# Patient Record
Sex: Male | Born: 1960 | Race: White | Hispanic: No | Marital: Married | Smoking: Never smoker
Health system: Southern US, Community
[De-identification: ages and names within clinical notes are randomized; demographics above are authoritative.]

## PROBLEM LIST (undated history)

## (undated) DIAGNOSIS — F32A Depression, unspecified: Secondary | ICD-10-CM

## (undated) DIAGNOSIS — Z973 Presence of spectacles and contact lenses: Secondary | ICD-10-CM

## (undated) DIAGNOSIS — R0602 Shortness of breath: Secondary | ICD-10-CM

## (undated) DIAGNOSIS — R002 Palpitations: Secondary | ICD-10-CM

## (undated) DIAGNOSIS — K5900 Constipation, unspecified: Secondary | ICD-10-CM

## (undated) DIAGNOSIS — R12 Heartburn: Secondary | ICD-10-CM

## (undated) DIAGNOSIS — M1990 Unspecified osteoarthritis, unspecified site: Secondary | ICD-10-CM

## (undated) DIAGNOSIS — K429 Umbilical hernia without obstruction or gangrene: Secondary | ICD-10-CM

## (undated) DIAGNOSIS — R079 Chest pain, unspecified: Secondary | ICD-10-CM

## (undated) DIAGNOSIS — G4730 Sleep apnea, unspecified: Secondary | ICD-10-CM

## (undated) DIAGNOSIS — E559 Vitamin D deficiency, unspecified: Secondary | ICD-10-CM

## (undated) DIAGNOSIS — E7800 Pure hypercholesterolemia, unspecified: Secondary | ICD-10-CM

## (undated) DIAGNOSIS — I4891 Unspecified atrial fibrillation: Secondary | ICD-10-CM

## (undated) DIAGNOSIS — M7989 Other specified soft tissue disorders: Secondary | ICD-10-CM

## (undated) DIAGNOSIS — M2550 Pain in unspecified joint: Secondary | ICD-10-CM

## (undated) DIAGNOSIS — R7303 Prediabetes: Secondary | ICD-10-CM

## (undated) DIAGNOSIS — Z87442 Personal history of urinary calculi: Secondary | ICD-10-CM

## (undated) DIAGNOSIS — Z889 Allergy status to unspecified drugs, medicaments and biological substances status: Secondary | ICD-10-CM

## (undated) DIAGNOSIS — K5790 Diverticulosis of intestine, part unspecified, without perforation or abscess without bleeding: Secondary | ICD-10-CM

## (undated) DIAGNOSIS — M549 Dorsalgia, unspecified: Secondary | ICD-10-CM

## (undated) DIAGNOSIS — F329 Major depressive disorder, single episode, unspecified: Secondary | ICD-10-CM

## (undated) HISTORY — DX: Pain in unspecified joint: M25.50

## (undated) HISTORY — PX: WRIST SURGERY: SHX841

## (undated) HISTORY — DX: Heartburn: R12

## (undated) HISTORY — DX: Constipation, unspecified: K59.00

## (undated) HISTORY — DX: Unspecified atrial fibrillation: I48.91

## (undated) HISTORY — DX: Sleep apnea, unspecified: G47.30

## (undated) HISTORY — DX: Other specified soft tissue disorders: M79.89

## (undated) HISTORY — PX: COLONOSCOPY: SHX174

## (undated) HISTORY — DX: Shortness of breath: R06.02

## (undated) HISTORY — DX: Diverticulosis of intestine, part unspecified, without perforation or abscess without bleeding: K57.90

## (undated) HISTORY — DX: Chest pain, unspecified: R07.9

## (undated) HISTORY — DX: Vitamin D deficiency, unspecified: E55.9

## (undated) HISTORY — PX: WISDOM TOOTH EXTRACTION: SHX21

## (undated) HISTORY — DX: Palpitations: R00.2

## (undated) HISTORY — PX: CARDIAC CATHETERIZATION: SHX172

---
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
## 2004-10-13 ENCOUNTER — Ambulatory Visit (HOSPITAL_COMMUNITY): Admission: RE | Admit: 2004-10-13 | Discharge: 2004-10-13 | Payer: Self-pay | Admitting: *Deleted

## 2004-10-29 ENCOUNTER — Encounter: Admission: RE | Admit: 2004-10-29 | Discharge: 2004-10-29 | Payer: Self-pay | Admitting: Emergency Medicine

## 2011-04-03 ENCOUNTER — Other Ambulatory Visit: Payer: Self-pay | Admitting: Family Medicine

## 2011-04-17 ENCOUNTER — Ambulatory Visit
Admission: RE | Admit: 2011-04-17 | Discharge: 2011-04-17 | Disposition: A | Discharge status: Home / Self Care | Payer: PRIVATE HEALTH INSURANCE | Source: Ambulatory Visit | Attending: Family Medicine | Admitting: Family Medicine

## 2012-07-20 ENCOUNTER — Emergency Department (HOSPITAL_COMMUNITY): Payer: PRIVATE HEALTH INSURANCE

## 2012-07-20 ENCOUNTER — Encounter (HOSPITAL_COMMUNITY): Payer: Self-pay | Admitting: *Deleted

## 2012-07-20 ENCOUNTER — Emergency Department (HOSPITAL_COMMUNITY)
Admission: EM | Admit: 2012-07-20 | Discharge: 2012-07-20 | Disposition: A | Discharge status: Home / Self Care | Payer: PRIVATE HEALTH INSURANCE | Attending: Emergency Medicine | Admitting: Emergency Medicine

## 2012-07-20 DIAGNOSIS — N201 Calculus of ureter: Secondary | ICD-10-CM | POA: Insufficient documentation

## 2012-07-20 DIAGNOSIS — N200 Calculus of kidney: Secondary | ICD-10-CM

## 2012-07-20 DIAGNOSIS — E7800 Pure hypercholesterolemia, unspecified: Secondary | ICD-10-CM | POA: Insufficient documentation

## 2012-07-20 DIAGNOSIS — R109 Unspecified abdominal pain: Secondary | ICD-10-CM | POA: Insufficient documentation

## 2012-07-20 HISTORY — DX: Pure hypercholesterolemia, unspecified: E78.00

## 2012-07-20 LAB — URINALYSIS, ROUTINE W REFLEX MICROSCOPIC
Bilirubin Urine: NEGATIVE
Glucose, UA: NEGATIVE mg/dL
Hgb urine dipstick: NEGATIVE
Ketones, ur: NEGATIVE mg/dL
Leukocytes, UA: NEGATIVE
Nitrite: NEGATIVE
Protein, ur: NEGATIVE mg/dL
Specific Gravity, Urine: 1.021 (ref 1.005–1.030)
Urobilinogen, UA: 1 mg/dL (ref 0.0–1.0)
pH: 6 (ref 5.0–8.0)

## 2012-07-20 MED ORDER — OXYCODONE-ACETAMINOPHEN 5-325 MG PO TABS: ORAL_TABLET | ORAL | Status: AC

## 2012-07-20 MED ORDER — HYDROMORPHONE HCL PF 1 MG/ML IJ SOLN
1.0000 mg | Freq: Once | INTRAMUSCULAR | Status: AC
  Administered 2012-07-20: 1 mg via INTRAVENOUS
  Filled 2012-07-20: qty 1

## 2012-07-20 MED ORDER — HYDROMORPHONE HCL PF 2 MG/ML IJ SOLN
2.0000 mg | Freq: Once | INTRAMUSCULAR | Status: AC
  Administered 2012-07-20: 2 mg via INTRAVENOUS
  Filled 2012-07-20: qty 1

## 2012-07-20 MED ORDER — ONDANSETRON 4 MG PO TBDP: 4.0000 mg | ORAL_TABLET | Freq: Three times a day (TID) | ORAL | Status: AC | PRN

## 2012-07-20 MED ORDER — KETOROLAC TROMETHAMINE 10 MG PO TABS: 10.0000 mg | ORAL_TABLET | Freq: Four times a day (QID) | ORAL | Status: AC | PRN

## 2012-07-20 MED ORDER — ONDANSETRON HCL 4 MG/2ML IJ SOLN
4.0000 mg | Freq: Once | INTRAMUSCULAR | Status: AC
  Administered 2012-07-20: 4 mg via INTRAVENOUS
  Filled 2012-07-20: qty 2

## 2012-07-20 MED ORDER — TAMSULOSIN HCL 0.4 MG PO CAPS: 0.4000 mg | ORAL_CAPSULE | Freq: Every day | ORAL | Status: AC

## 2012-07-20 NOTE — ED Notes (Signed)
Pt. Reports pain in left flank starting x2 hours ago. States "it woke me from my sleep". Pt. c/o N/V. Pt. Breathing heavy, sweating, unable to lie down. Pt. Denies dysuria, but states "I haven't gone to the bathroom since the pain started". Denies hx of kidney stones.

## 2012-07-20 NOTE — ED Notes (Signed)
Pt c/o left flank pain and abdominal pain x 2 hours with nausea and vomiting.  Denies dysuria.

## 2012-07-20 NOTE — ED Provider Notes (Signed)
Medical screening examination/treatment/procedure(s) were performed by non-physician practitioner and as supervising physician I was immediately available for consultation/collaboration.    Nelia Shi, MD 07/20/12 [phone]

## 2012-07-20 NOTE — ED Provider Notes (Signed)
History     CSN: [id number]  Arrival date & time 07/20/12  0503   None     Chief Complaint  Patient presents with  . Flank Pain    (Consider location/radiation/quality/duration/timing/severity/associated sxs/prior treatment) The history is provided by the patient and the spouse.    51 y.o. male appears uncomfortable and unable to sit still complaining of acute onset of left flank pain at 3 AM this morning 10 out of 10 radiating to anterior side. Pain has migrated down to left groin area still 10 out of 10. Patient denies fever affirms nausea and vomiting 3 times this morning. Denies history of kidney stones, dysuria or hematuria.   Past Medical History  Diagnosis Date  . Hypercholesteremia     Past Surgical History  Procedure Date  . Wrist surgery     left    History reviewed. No pertinent family history.  History  Substance Use Topics  . Smoking status: Never Smoker   . Smokeless tobacco: Not on file  . Alcohol Use: Yes      Review of Systems  Allergies  Penicillins  Home Medications   Current Outpatient Rx  Name Route Sig Dispense Refill  . ATORVASTATIN CALCIUM 20 MG PO TABS Oral Take 20 mg by mouth daily.    Marland Kitchen ESCITALOPRAM OXALATE 10 MG PO TABS Oral Take 10 mg by mouth daily.    . TESTOSTERONE 30 MG/ACT TD SOLN Transdermal Place onto the skin daily.       BP 172/99  Pulse 67  Temp 98.4 F (36.9 C) (Oral)  Resp 18  SpO2 96%  Physical Exam  Nursing note and vitals reviewed. Constitutional: He is oriented to person, place, and time. He appears well-developed and well-nourished. No distress.       Appears uncomfortable in pain. He is pacing and cannot sit still.  HENT:  Head: Normocephalic.  Eyes: Conjunctivae and EOM are normal. Pupils are equal, round, and reactive to light.  Cardiovascular: Normal rate, regular rhythm and normal heart sounds.   Pulmonary/Chest: Effort normal and breath sounds normal. No respiratory distress. He has no wheezes.  He has no rales. He exhibits no tenderness.  Abdominal: Soft. Bowel sounds are normal. He exhibits no distension and no mass. There is no tenderness. There is no rebound and no guarding.  Musculoskeletal: Normal range of motion.       Mild CVA tenderness on the left side.  Neurological: He is alert and oriented to person, place, and time.  Psychiatric: He has a normal mood and affect.    ED Course  Procedures (including critical care time)   Labs Reviewed  URINALYSIS, ROUTINE W REFLEX MICROSCOPIC   Ct Abdomen Pelvis Wo Contrast  07/20/2012  *RADIOLOGY REPORT*  Clinical Data: Left flank pain radiating to the left groin.  CT ABDOMEN AND PELVIS WITHOUT CONTRAST  Technique:  Multidetector CT imaging of the abdomen and pelvis was performed following the standard protocol without intravenous contrast.  Comparison: 10/29/2004  Findings: Stable scarring noted at the left lung base.  Mild cardiomegaly noted.  The visualized portion of the liver, spleen, pancreas, and adrenal glands appear unremarkable in noncontrast CT appearance.  The gallbladder and biliary system appear unremarkable.  No pathologic retroperitoneal or porta hepatis adenopathy is identified.  Umbilical hernia measures 1.9 cm at the neck and contains a 2.3 cm focus of omental adipose tissue.  There is slight fullness of the left collecting system along with mild left hydroureter and periureteral stranding extending  down to a 2 mm calculus in the distal left ureter about 9 mm proximal to the left UVJ.  Right ureter unremarkable.  No additional calculi noted.  There is mild bilateral perirenal stranding, left greater than right.  No pathologic pelvic adenopathy is identified.  Diverticulosis of the descending and sigmoid colon noted without active diverticulitis identified.  Fatty spermatic cords with chronic stranding along both inguinal regions noted.  Urinary bladder appears unremarkable.  The appendix appears normal.  IMPRESSION:  1.   Borderline obstructive 2 mm left distal ureteral calculus associated with minimal left hydroureter, periureteral stranding, and mild fullness of the left collecting system without overt hydronephrosis. 2.  Descending and sigmoid colon diverticulosis without active diverticulitis. 3.  Umbilical hernia contains omental adipose tissue. 4.  Mild cardiomegaly.   Original Report Authenticated By: Dellia Cloud, M.D.      1. Nephrolithiasis       MDM  51 y.o. male with left flank pain radiating to the left groin extreme at 10 out of 10 with nausea and vomiting acute onset at 3 AM. Physical shows a mild left CVA tenderness. Urinalysis shows no blood or signs of infection.  CT stone protocol reveals a borderline obstructive 2 mm left distal ureteral stone with minimal left hydroureter. I will discharge the patient with Percocet, Zofran, Flomax and urology followup.  Discussed case with attending who agrees with plan and stability to d/c to home.  Pt verbalized understanding and agrees with care plan. Outpatient follow-up and return precautions given.            Wynetta Emery, PA-C 07/20/12 [phone]

## 2018-03-15 ENCOUNTER — Ambulatory Visit
Admission: RE | Admit: 2018-03-15 | Discharge: 2018-03-15 | Disposition: A | Discharge status: Home / Self Care | Payer: PRIVATE HEALTH INSURANCE | Source: Ambulatory Visit | Attending: Family Medicine | Admitting: Family Medicine

## 2018-03-15 ENCOUNTER — Other Ambulatory Visit: Payer: Self-pay | Admitting: Family Medicine

## 2018-03-15 DIAGNOSIS — R109 Unspecified abdominal pain: Secondary | ICD-10-CM

## 2018-03-15 MED ORDER — IOPAMIDOL (ISOVUE-300) INJECTION 61%
100.0000 mL | Freq: Once | INTRAVENOUS | Status: AC | PRN
  Administered 2018-03-15: 100 mL via INTRAVENOUS

## 2018-04-27 DIAGNOSIS — M5416 Radiculopathy, lumbar region: Secondary | ICD-10-CM | POA: Insufficient documentation

## 2018-06-23 ENCOUNTER — Ambulatory Visit (HOSPITAL_COMMUNITY)
Admission: EM | Admit: 2018-06-23 | Discharge: 2018-06-23 | Disposition: A | Discharge status: Home / Self Care | Payer: PRIVATE HEALTH INSURANCE | Attending: Family Medicine | Admitting: Family Medicine

## 2018-06-23 ENCOUNTER — Encounter (HOSPITAL_COMMUNITY): Payer: Self-pay

## 2018-06-23 DIAGNOSIS — L03818 Cellulitis of other sites: Secondary | ICD-10-CM | POA: Diagnosis not present

## 2018-06-23 MED ORDER — CEFDINIR 300 MG PO CAPS: 300.0000 mg | ORAL_CAPSULE | Freq: Two times a day (BID) | ORAL | 0 refills | Status: DC

## 2018-06-23 NOTE — ED Triage Notes (Signed)
Pt presents with left foot pain.

## 2018-06-23 NOTE — Discharge Instructions (Addendum)
Continue taking your anti-inflammatory medicine.

## 2018-07-06 ENCOUNTER — Encounter (HOSPITAL_COMMUNITY): Payer: Self-pay

## 2018-07-06 ENCOUNTER — Other Ambulatory Visit: Payer: Self-pay

## 2018-07-06 ENCOUNTER — Ambulatory Visit (HOSPITAL_COMMUNITY)
Admission: EM | Admit: 2018-07-06 | Discharge: 2018-07-06 | Disposition: A | Discharge status: Home / Self Care | Payer: PRIVATE HEALTH INSURANCE | Attending: Family Medicine | Admitting: Family Medicine

## 2018-07-06 DIAGNOSIS — S91102D Unspecified open wound of left great toe without damage to nail, subsequent encounter: Secondary | ICD-10-CM | POA: Diagnosis not present

## 2018-07-06 DIAGNOSIS — S91109D Unspecified open wound of unspecified toe(s) without damage to nail, subsequent encounter: Secondary | ICD-10-CM

## 2018-07-06 MED ORDER — SULFAMETHOXAZOLE-TRIMETHOPRIM 800-160 MG PO TABS: 1.0000 | ORAL_TABLET | Freq: Two times a day (BID) | ORAL | 0 refills | Status: DC

## 2018-07-06 NOTE — ED Triage Notes (Signed)
Left toe pain

## 2018-07-07 NOTE — ED Provider Notes (Signed)
[facility]   [id number] 07/06/18 Arrival Time: 1841  ASSESSMENT & PLAN:  1. Open wound of toe, subsequent encounter    Raised nail fold with small amount of purulent drainage. To soak BID. Start: Meds ordered this encounter  Medications  . sulfamethoxazole-trimethoprim (BACTRIM DS,SEPTRA DS) 800-160 MG tablet    Sig: Take 1 tablet by mouth 2 (two) times daily.    Dispense:  14 tablet    Refill:  0    Will f/u here if not seeing improvement over the next few days. OTC analgesics as needed.  Reviewed expectations re: course of current medical issues. Questions answered. Outlined signs and symptoms indicating need for more acute intervention. Patient verbalized understanding. After Visit Summary given.   SUBJECTIVE:  Earl Hogan is a 57 y.o. male who is here for f/u concerning wound to his L great toe. At last visit dx with cellulitis. This is much better. Now with isolated redness around nailfold. Very tender. No drainage. Afebrile. Has been putting Neosporin on. Ambulatory without problem.  ROS: As per HPI.  OBJECTIVE: Vitals:   07/06/18 1941 07/06/18 1944  BP: (!) 123/111   Pulse: 70   Resp: 18   Temp: 97.8 F (36.6 C)   TempSrc: Tympanic   SpO2: 98%   Weight:  119.7 kg    General appearance: alert; no distress Lungs: clear to auscultation bilaterally Heart: regular rate and rhythm Extremities: no edema Skin: warm and dry; L great toe with paronychia/erythema around nail; tender to touch; cellulitis has resolved Psychological: alert and cooperative; normal mood and affect  Allergies  Allergen Reactions  . Penicillins Other (See Comments)    Unknown childhood    Past Medical History:  Diagnosis Date  . Hypercholesteremia    Social History   Socioeconomic History  . Marital status: Single    Spouse name: Not on file  . Number of children: Not on file  . Years of education: Not on file  . Highest education level: Not on file    Occupational History  . Not on file  Social Needs  . Financial resource strain: Not on file  . Food insecurity:    Worry: Not on file    Inability: Not on file  . Transportation needs:    Medical: Not on file    Non-medical: Not on file  Tobacco Use  . Smoking status: Never Smoker  . Smokeless tobacco: Never Used  Substance and Sexual Activity  . Alcohol use: Yes  . Drug use: No  . Sexual activity: Not on file  Lifestyle  . Physical activity:    Days per week: Not on file    Minutes per session: Not on file  . Stress: Not on file  Relationships  . Social connections:    Talks on phone: Not on file    Gets together: Not on file    Attends religious service: Not on file    Active member of club or organization: Not on file    Attends meetings of clubs or organizations: Not on file    Relationship status: Not on file  . Intimate partner violence:    Fear of current or ex partner: Not on file    Emotionally abused: Not on file    Physically abused: Not on file    Forced sexual activity: Not on file  Other Topics Concern  . Not on file  Social History Narrative  . Not on file   History reviewed. No pertinent  family history. Past Surgical History:  Procedure Laterality Date  . WRIST SURGERY     left     Mardella Layman, MD 07/07/18 1009

## 2018-07-12 NOTE — ED Provider Notes (Signed)
[facility]   [id number] 06/23/18 Arrival Time: 1721  ASSESSMENT & PLAN:  1. Cellulitis of other specified site     Meds ordered this encounter  Medications  . cefdinir (OMNICEF) 300 MG capsule    Sig: Take 1 capsule (300 mg total) by mouth 2 (two) times daily.    Dispense:  20 capsule    Refill:  0   Will outline with pen to watch closely. Will return if not seeing improvement over the next 24-48 hours. OTC analgesics as needed.   Reviewed expectations re: course of current medical issues. Questions answered. Outlined signs and symptoms indicating need for more acute intervention. Patient verbalized understanding. After Visit Summary given.  SUBJECTIVE: History from: patient. Earl Hogan is a 57 y.o. male who reports injury to his L foot. Slipped on ladder. Scrape to skin. Few days ago. No increasing redness and warmth. Moderate tenderness. Does swell. Ambulatory with mild discomfort. No extremity sensation changes or weakness. No specific aggravating or alleviating factors reported. No home tx. Td is UTD. Afebrile.  ROS: As per HPI.   OBJECTIVE:  Vitals:   06/23/18 1823  BP: (!) 141/81  Pulse: 67  Resp: 18  Temp: 98.6 F (37 C)  TempSrc: Oral  SpO2: 99%    General appearance: alert; no distress Extremities: warm and well perfused; symmetrical with no gross deformities; L dorsal foot at great toe with abrasion and surrounding erythema/warmth consistent with cellulitis; L foot with FROM CV: normal extremity capillary refill Skin: warm and dry Neurologic: normal gait; normal symmetric reflexes in all extremities; normal sensation in all extremities Psychological: alert and cooperative; normal mood and affect  Allergies  Allergen Reactions  . Penicillins Other (See Comments)    Unknown childhood    Past Medical History:  Diagnosis Date  . Hypercholesteremia    Social History   Socioeconomic History  . Marital status: Single    Spouse name:  Not on file  . Number of children: Not on file  . Years of education: Not on file  . Highest education level: Not on file  Occupational History  . Not on file  Social Needs  . Financial resource strain: Not on file  . Food insecurity:    Worry: Not on file    Inability: Not on file  . Transportation needs:    Medical: Not on file    Non-medical: Not on file  Tobacco Use  . Smoking status: Never Smoker  . Smokeless tobacco: Never Used  Substance and Sexual Activity  . Alcohol use: Yes  . Drug use: No  . Sexual activity: Not on file  Lifestyle  . Physical activity:    Days per week: Not on file    Minutes per session: Not on file  . Stress: Not on file  Relationships  . Social connections:    Talks on phone: Not on file    Gets together: Not on file    Attends religious service: Not on file    Active member of club or organization: Not on file    Attends meetings of clubs or organizations: Not on file    Relationship status: Not on file  . Intimate partner violence:    Fear of current or ex partner: Not on file    Emotionally abused: Not on file    Physically abused: Not on file    Forced sexual activity: Not on file  Other Topics Concern  . Not on file  Social History  Narrative  . Not on file   History reviewed. No pertinent family history. Past Surgical History:  Procedure Laterality Date  . WRIST SURGERY     left      Mardella LaymanHagler, Cristobal Advani, MD 07/12/18 [phone]

## 2019-04-22 ENCOUNTER — Emergency Department (HOSPITAL_COMMUNITY)
Admission: EM | Admit: 2019-04-22 | Discharge: 2019-04-22 | Disposition: A | Discharge status: Home / Self Care | Payer: PRIVATE HEALTH INSURANCE | Attending: Emergency Medicine | Admitting: Emergency Medicine

## 2019-04-22 ENCOUNTER — Other Ambulatory Visit: Payer: Self-pay

## 2019-04-22 ENCOUNTER — Emergency Department (HOSPITAL_COMMUNITY): Payer: PRIVATE HEALTH INSURANCE

## 2019-04-22 ENCOUNTER — Encounter (HOSPITAL_COMMUNITY): Payer: Self-pay | Admitting: Emergency Medicine

## 2019-04-22 DIAGNOSIS — R1033 Periumbilical pain: Secondary | ICD-10-CM | POA: Diagnosis present

## 2019-04-22 DIAGNOSIS — K420 Umbilical hernia with obstruction, without gangrene: Secondary | ICD-10-CM | POA: Diagnosis not present

## 2019-04-22 DIAGNOSIS — Z79899 Other long term (current) drug therapy: Secondary | ICD-10-CM | POA: Diagnosis not present

## 2019-04-22 LAB — COMPREHENSIVE METABOLIC PANEL
ALT: 25 U/L (ref 0–44)
AST: 17 U/L (ref 15–41)
Albumin: 4.8 g/dL (ref 3.5–5.0)
Alkaline Phosphatase: 75 U/L (ref 38–126)
Anion gap: 12 (ref 5–15)
BUN: 20 mg/dL (ref 6–20)
CO2: 22 mmol/L (ref 22–32)
Calcium: 9.7 mg/dL (ref 8.9–10.3)
Chloride: 104 mmol/L (ref 98–111)
Creatinine, Ser: 1.01 mg/dL (ref 0.61–1.24)
GFR calc Af Amer: >60 mL/min (ref >60)
GFR calc non Af Amer: >60 mL/min (ref >60)
Glucose, Bld: 158 mg/dL — ABNORMAL HIGH (ref 70–99)
Potassium: 3.9 mmol/L (ref 3.5–5.1)
Sodium: 138 mmol/L (ref 135–145)
Total Bilirubin: 1 mg/dL (ref 0.3–1.2)
Total Protein: 7.4 g/dL (ref 6.5–8.1)

## 2019-04-22 LAB — CBC WITH DIFFERENTIAL/PLATELET
Abs Immature Granulocytes: 0.06 10*3/uL (ref 0.00–0.07)
Basophils Absolute: 0.1 10*3/uL (ref 0.0–0.1)
Basophils Relative: 1 %
Eosinophils Absolute: 0 10*3/uL (ref 0.0–0.5)
Eosinophils Relative: 0 %
HCT: 45.8 % (ref 39.0–52.0)
Hemoglobin: 15.5 g/dL (ref 13.0–17.0)
Immature Granulocytes: 0 %
Lymphocytes Relative: 21 %
Lymphs Abs: 2.9 10*3/uL (ref 0.7–4.0)
MCH: 28.7 pg (ref 26.0–34.0)
MCHC: 33.8 g/dL (ref 30.0–36.0)
MCV: 84.7 fL (ref 80.0–100.0)
Monocytes Absolute: 1.3 10*3/uL — ABNORMAL HIGH (ref 0.1–1.0)
Monocytes Relative: 9 %
Neutro Abs: 9.8 10*3/uL — ABNORMAL HIGH (ref 1.7–7.7)
Neutrophils Relative %: 69 %
Platelets: 405 10*3/uL — ABNORMAL HIGH (ref 150–400)
RBC: 5.41 MIL/uL (ref 4.22–5.81)
RDW: 11.8 % (ref 11.5–15.5)
WBC: 14.2 10*3/uL — ABNORMAL HIGH (ref 4.0–10.5)
nRBC: 0 % (ref 0.0–0.2)

## 2019-04-22 LAB — LACTIC ACID, PLASMA: Lactic Acid, Venous: 2.6 mmol/L (ref 0.5–1.9)

## 2019-04-22 LAB — LIPASE, BLOOD: Lipase: 24 U/L (ref 11–51)

## 2019-04-22 LAB — I-STAT CREATININE, ED: Creatinine, Ser: 0.9 mg/dL (ref 0.61–1.24)

## 2019-04-22 MED ORDER — HYDROMORPHONE HCL 1 MG/ML IJ SOLN
1.0000 mg | Freq: Once | INTRAMUSCULAR | Status: AC
  Administered 2019-04-22: 16:00:00 1 mg via INTRAVENOUS
  Filled 2019-04-22: qty 1

## 2019-04-22 MED ORDER — IOHEXOL 350 MG/ML SOLN
100.0000 mL | Freq: Once | INTRAVENOUS | Status: AC | PRN
  Administered 2019-04-22: 16:00:00 100 mL via INTRAVENOUS

## 2019-04-22 MED ORDER — DOCUSATE SODIUM 250 MG PO CAPS: 250.0000 mg | ORAL_CAPSULE | Freq: Every day | ORAL | 0 refills | Status: AC

## 2019-04-22 MED ORDER — HYDROMORPHONE HCL 1 MG/ML IJ SOLN
1.0000 mg | Freq: Once | INTRAMUSCULAR | Status: AC
  Administered 2019-04-22: 15:00:00 1 mg via INTRAVENOUS
  Filled 2019-04-22: qty 1

## 2019-04-22 MED ORDER — SODIUM CHLORIDE 0.9 % IV BOLUS
1000.0000 mL | Freq: Once | INTRAVENOUS | Status: AC
  Administered 2019-04-22: 15:00:00 1000 mL via INTRAVENOUS

## 2019-04-22 MED ORDER — ONDANSETRON HCL 4 MG/2ML IJ SOLN
4.0000 mg | Freq: Once | INTRAMUSCULAR | Status: AC
  Administered 2019-04-22: 4 mg via INTRAVENOUS
  Filled 2019-04-22: qty 2

## 2019-04-22 NOTE — Discharge Instructions (Addendum)
Do not lift anything heavy (nothing >5-10 lb) until seen by surgery  Take the stool softener as described  Return to the ER immediately with recurrence of abdominal pain, vomiting, fever, or swelling/firmness of your hernia

## 2019-04-22 NOTE — ED Triage Notes (Addendum)
Right lower abd pain started about an hour and a half ago, became overheated while working in attic, tried to eat but vomited x 3.  abd swollen, states "looks bigger"  On a 6 day course of prednisone for back pain-- dr Ethelene Hal.  Pt is pacing, unable to sit still or get in bed

## 2019-04-22 NOTE — ED Triage Notes (Signed)
Pt in with low abdominal pain x 1.5hrs. states he was in the attic working and began to get overheated, stated sharp abdominal pain and nausea started. Has hernia present, pain worse when sitting. Emesis x 3 in past hr

## 2019-04-22 NOTE — ED Notes (Signed)
Patient verbalizes understanding of discharge instructions. Opportunity for questioning and answers were provided. Armband removed by staff, pt discharged from ED.  

## 2019-04-22 NOTE — ED Provider Notes (Signed)
[facility] EMERGENCY DEPARTMENT Provider Note   CSN: [id number] Arrival date & time: 04/22/19  1434    History   Chief Complaint Chief Complaint  Patient presents with  . Abdominal Pain  . Emesis    HPI Earl Hogan is a 58 y.o. male.     HPI   58 yo M with PMHx HLD here with severe abd pain. Pt was in usual state of health until just PTA. He was doing work around the house clearing the attic of [employer], was feeling fine but did not eat or dirnk much. Started to experience initially mild periumbilical and lower abd pain that has since become severe, 10/10, aching, gnawing, but also sharp and radiating to his back. Pain worse w/ movement, palpation. No alleviating factors. He also became nauseous and vomited x 2. Reports that he's had recent worsening of chronic back pain and has bene on meloxicam and prednisone, btu no h/o ulcers and has not had any blood in stools and no blood in his emesis. No fevers. No other complaints. No alleviating factors.  Past Medical History:  Diagnosis Date  . Hypercholesteremia   . Kidney stone     There are no active problems to display for this patient.   Past Surgical History:  Procedure Laterality Date  . WRIST SURGERY     left        Home Medications    Prior to Admission medications   Medication Sig Start Date End Date Taking? Authorizing Provider  atorvastatin (LIPITOR) 20 MG tablet Take 20 mg by mouth daily.    [provider]  docusate sodium (COLACE) 250 MG capsule Take 1 capsule (250 mg total) by mouth daily for 10 days. 04/22/19 05/02/19  Shaune Pollack, MD  escitalopram (LEXAPRO) 10 MG tablet Take 10 mg by mouth daily.    [provider]  sulfamethoxazole-trimethoprim (BACTRIM DS,SEPTRA DS) 800-160 MG tablet Take 1 tablet by mouth 2 (two) times daily. 07/06/18   Mardella Layman, MD  Tamsulosin HCl (FLOMAX) 0.4 MG CAPS Take 1 capsule (0.4 mg total) by mouth daily after breakfast. 07/20/12    Pisciotta, Joni Reining, PA-C  Testosterone (AXIRON) 30 MG/ACT SOLN Place onto the skin daily.     [provider]    Family History No family history on file.  Social History Social History   Tobacco Use  . Smoking status: Never Smoker  . Smokeless tobacco: Never Used  Substance Use Topics  . Alcohol use: Yes    Comment: occasional  . Drug use: No     Allergies   Penicillins   Review of Systems Review of Systems  Constitutional: Positive for fatigue. Negative for chills and fever.  HENT: Negative for congestion and rhinorrhea.   Eyes: Negative for visual disturbance.  Respiratory: Negative for cough, shortness of breath and wheezing.   Cardiovascular: Negative for chest pain and leg swelling.  Gastrointestinal: Positive for abdominal distention, abdominal pain, nausea and vomiting. Negative for diarrhea.  Genitourinary: Negative for dysuria and flank pain.  Musculoskeletal: Negative for neck pain and neck stiffness.  Skin: Negative for rash and wound.  Allergic/Immunologic: Negative for immunocompromised state.  Neurological: Positive for weakness. Negative for syncope and headaches.  All other systems reviewed and are negative.    Physical Exam Updated Vital Signs BP (!) 145/66   Pulse (!) 54   Temp 97.8 F (36.6 C) (Oral)   Resp 15   Wt 119.7 kg   SpO2 96%  Physical Exam Vitals signs and nursing note reviewed.  Constitutional:      General: He is in acute distress.     Appearance: He is well-developed. He is diaphoretic.  HENT:     Head: Normocephalic and atraumatic.  Eyes:     Conjunctiva/sclera: Conjunctivae normal.  Neck:     Musculoskeletal: Neck supple.  Cardiovascular:     Rate and Rhythm: Normal rate and regular rhythm.     Heart sounds: Normal heart sounds. No murmur. No friction rub.  Pulmonary:     Effort: Pulmonary effort is normal. No respiratory distress.     Breath sounds: Normal breath sounds. No wheezing or rales.   Abdominal:     General: Abdomen is protuberant. Bowel sounds are increased. There is no distension.     Palpations: Abdomen is soft.     Tenderness: There is generalized abdominal tenderness and tenderness in the periumbilical area.     Hernia: A hernia is present. Hernia is present in the umbilical area.  Skin:    General: Skin is warm.     Capillary Refill: Capillary refill takes less than 2 seconds.  Neurological:     Mental Status: He is alert and oriented to person, place, and time.     Motor: No abnormal muscle tone.      ED Treatments / Results  Labs (all labs ordered are listed, but only abnormal results are displayed) Labs Reviewed  CBC WITH DIFFERENTIAL/PLATELET - Abnormal; Notable for the following components:      Result Value   WBC 14.2 (*)    Platelets 405 (*)    Neutro Abs 9.8 (*)    Monocytes Absolute 1.3 (*)    All other components within normal limits  COMPREHENSIVE METABOLIC PANEL - Abnormal; Notable for the following components:   Glucose, Bld 158 (*)    All other components within normal limits  LACTIC ACID, PLASMA - Abnormal; Notable for the following components:   Lactic Acid, Venous 2.6 (*)    All other components within normal limits  LIPASE, BLOOD  URINALYSIS, ROUTINE W REFLEX MICROSCOPIC  LACTIC ACID, PLASMA  I-STAT CREATININE, ED    EKG EKG Interpretation  Date/Time:  Saturday Apr 22 2019 15:20:56 EDT Ventricular Rate:  56 PR Interval:    QRS Duration: 111 QT Interval:  433 QTC Calculation: 418 R Axis:   75 Text Interpretation:  Atrial fibrillation Ventricular premature complex No old tracing to compare Reconfirmed by Shaune Pollack [id number]) on 04/22/2019 3:25:38 PM   Radiology Ct Angio Chest/abd/pel For Dissection W And/or Wo Contrast  Result Date: 04/22/2019 CLINICAL DATA:  RIGHT lower abdominal pain starting approximately 1 hour and a half ago, vomiting, abdominal swelling. On 6 day course of prednisone for back pain. Chest/back  pain, acute, aortic dissection suspected. EXAM: CT ANGIOGRAPHY CHEST, ABDOMEN AND PELVIS TECHNIQUE: Multidetector CT imaging through the chest, abdomen and pelvis was performed using the standard protocol during bolus administration of intravenous contrast. Multiplanar reconstructed images and MIPs were obtained and reviewed to evaluate the vascular anatomy. CONTRAST:  OMNIPAQUE IOHEXOL 350 MG/ML SOLN COMPARISON:  CT abdomen and pelvis dated 03/15/2018. FINDINGS: CTA CHEST FINDINGS Cardiovascular: Thoracic aorta is normal in caliber and configuration. No thoracic aortic aneurysm or evidence of aortic dissection. No pericardial effusion. No pulmonary embolism appreciated within the main, lobar or segmental pulmonary arteries bilaterally. Mediastinum/Nodes: No mass or enlarged lymph nodes seen within the mediastinum or perihilar regions. Esophagus is unremarkable. Trachea and central bronchi are unremarkable.  Lungs/Pleura: Mild scarring/atelectasis at the LEFT lung base. 5 mm pleural based nodule at the upper margin of the LEFT lung fissure, suspected atelectasis or fissural lymph node. Lungs otherwise clear. Musculoskeletal: No acute or suspicious osseous finding. Review of the MIP images confirms the above findings. CTA ABDOMEN AND PELVIS FINDINGS VASCULAR Aorta: Normal caliber aorta without aneurysm, dissection, vasculitis or significant stenosis. Celiac: Patent without evidence of aneurysm, dissection, vasculitis or significant stenosis. SMA: Patent without evidence of aneurysm, dissection, vasculitis or significant stenosis. Renals: Both renal arteries are patent without evidence of aneurysm, dissection, vasculitis, fibromuscular dysplasia or significant stenosis. IMA: Patent without evidence of aneurysm, dissection, vasculitis or significant stenosis. Inflow: Patent without evidence of aneurysm, dissection, vasculitis or significant stenosis. Veins: No obvious venous abnormality within the limitations of  this arterial phase study. Review of the MIP images confirms the above findings. NON-VASCULAR Hepatobiliary: No focal liver abnormality is seen. No gallstones, gallbladder wall thickening, or biliary dilatation. Pancreas: Unremarkable. No pancreatic ductal dilatation or surrounding inflammatory changes. Spleen: Normal in size without focal abnormality. Adrenals/Urinary Tract: Adrenal glands appear normal. Kidneys are unremarkable without mass, stone or hydronephrosis. No ureteral or bladder calculi identified. Bladder is unremarkable, partially decompressed. Stomach/Bowel: Scattered diverticulosis throughout the colon but no focal inflammatory change to suggest acute diverticulitis. No dilated large or small bowel loops. Anterior umbilical abdominal wall hernia now contains a portion of the small bowel (contain fat only on CT abdomen of 03/15/2018). More proximal small bowel is nondistended with no confirmation of associated bowel obstruction at this time. Suspect at least mild thickening/inflammation of the small bowel within the hernia sac. Lymphatic: No enlarged lymph nodes seen. Reproductive: Prostate gland is upper normal in size. Other: No free fluid or abscess collection. No free intraperitoneal air. Musculoskeletal: No acute or suspicious osseous finding. Review of the MIP images confirms the above findings. IMPRESSION: 1. Umbilical hernia now contains a portion of the small bowel (the umbilical hernia contained fat only on CT abdomen of 03/15/2018). No evidence of associated bowel obstruction at this time (more proximal small bowel is nondistended). Suspect mild thickening/inflammation of the small bowel within the hernia sac which could indicate incarceration and impending obstruction. On the previous study, the opening to the hernia sac measured approximately 2 cm width. 2. Normal thoracic and abdominal aorta. No aortic aneurysm or dissection. 3. No acute findings within the chest. No pulmonary embolism  seen. Mild scarring/atelectasis at the left lung base. 5 mm pleural based nodule at the upper margin of the left lung fissure, suspected atelectasis or fissural lymph node. No follow-up needed if patient is low-risk. Non-contrast chest CT can be considered in 12 months if patient is high-risk. This recommendation follows the consensus statement: Guidelines for Management of Incidental Pulmonary Nodules Detected on CT Images: From the Fleischner Society 2017; Radiology 2017; 284:228-243. 4. Colonic diverticulosis without evidence of acute diverticulitis. Electronically Signed   By: Bary Richard M.D.   On: 04/22/2019 16:17    Procedures Hernia reduction Date/Time: 04/22/2019 4:35 PM Performed by: Shaune Pollack, MD Authorized by: Shaune Pollack, MD  Consent: The procedure was performed in an emergent situation. Verbal consent obtained. Risks and benefits: risks, benefits and alternatives were discussed Consent given by: patient Patient understanding: patient states understanding of the procedure being performed Patient consent: the patient's understanding of the procedure matches consent given Procedure consent: procedure consent matches procedure scheduled Relevant documents: relevant documents present and verified Test results: test results available and properly labeled Site marked: the operative  site was marked Imaging studies: imaging studies available Required items: required blood products, implants, devices, and special equipment available Patient identity confirmed: verbally with patient Time out: Immediately prior to procedure a "time out" was called to verify the correct patient, procedure, equipment, support staff and site/side marked as required. Preparation: Patient was prepped and draped in the usual sterile fashion. Patient tolerance: Patient tolerated the procedure well with no immediate complications Comments: Gentle manual manipulation performed with successful reduction,  tolerated well with resolution of pain.    (including critical care time)  Medications Ordered in ED Medications  HYDROmorphone (DILAUDID) injection 1 mg (1 mg Intravenous Given 04/22/19 1517)  ondansetron (ZOFRAN) injection 4 mg (4 mg Intravenous Given 04/22/19 1516)  sodium chloride 0.9 % bolus 1,000 mL (1,000 mLs Intravenous New Bag/Given 04/22/19 1517)  iohexol (OMNIPAQUE) 350 MG/ML injection 100 mL (100 mLs Intravenous Contrast Given 04/22/19 1544)  HYDROmorphone (DILAUDID) injection 1 mg (1 mg Intravenous Given 04/22/19 1608)     Initial Impression / Assessment and Plan / ED Course  I have reviewed the triage vital signs and the nursing notes.  Pertinent labs & imaging results that were available during my care of the patient were reviewed by me and considered in my medical decision making (see chart for details).  Clinical Course as of Apr 21 1752  Sat Apr 22, 2019  1706 58 yo M here with severe abd pain. On arrival, pt in obvious distress, diaphoretic. Exam c/f umbilical hernia incarceration but given back pain, sent for stat CT Dissection. Fortunately CT scan shows no aortic pathology and confirms incarcerated hernia, no obvious obstruction or gangrene. Labs show likely reactive leukocytosis, mild LA elevation 2/2 dehydration and vomiting but no evidence of AKI, normal LFTs. Following analgesia, hernia reduced by myself. Pt markedly improved. Tolerating PO and ambulatory without recurrence. D/w Dr. Sheliah HatchKinsinger of [facility]. Will have pt avoid heavy lifting/straining, refer for urgent f/u in clinic. Return precautions given. Stool softeners x 1 week.   [CI]    Clinical Course User Index [CI] Shaune PollackIsaacs, Azra Abrell, MD       Final Clinical Impressions(s) / ED Diagnoses   Final diagnoses:  Umbilical hernia with obstruction, without gangrene    ED Discharge Orders         Ordered    docusate sodium (COLACE) 250 MG capsule  Daily     04/22/19 1752           Shaune PollackIsaacs, Sheila Gervasi, MD 04/22/19  1753

## 2019-04-22 NOTE — ED Notes (Signed)
Pt given diet ginger ale; ok per Dr. Erma Heritage.

## 2019-04-28 ENCOUNTER — Ambulatory Visit: Payer: Self-pay | Admitting: General Surgery

## 2019-04-28 NOTE — H&P (View-Only) (Signed)
History of Present Illness Earl Hogan(Earl Eckerson MD; 04/28/2019 10:12 AM) The patient is a 58 year old male who presents with an umbilical hernia. Referred by: Dr. Erma HeritageIsaacs Chief Complaint: Umbilical hernia  Patient is a 58 year old male with history of chronic back pain, hyperlipidemia, obesity, who comes in with a history of an incarcerated umbilical hernia. Patient was recently in the ER secondary to abdominal pain, nausea, vomiting. Patient at the time of his ER visit underwent CT scan. I did review this personally. Patient had an incarcerated piece of small bowel within the umbilical hernia. This was reduced at the bedside read patient was discharged thereafter.  In discussion with the patient the hernia has been there for one to 2 years. He states it usually does not give him any pain however at times Become red on the surface. Patient denies any other previous episodes of incarceration or strangulation.    Past Surgical History [name][place]; 04/28/2019 9:55 AM) Vasectomy   Diagnostic Studies History [name][place]; 04/28/2019 9:55 AM) Colonoscopy  1-5 years ago  Allergies [name](Earl [place]; 04/28/2019 9:56 AM) Penicillins  Allergies Reconciled   Medication History [name](Earl Hogan, CMA; 04/28/2019 9:57 AM) Atorvastatin Calcium (20MG  Tablet, Oral) Active. Tamsulosin HCl (0.4MG  Capsule, Oral) Active. Meloxicam (15MG  Tablet, Oral) Active. Escitalopram Oxalate (10MG  Tablet, Oral) Active. [employer] Stool Softener (250MG  Capsule, Oral) Active. Medications Reconciled  Social History [name]([place]; 04/28/2019 9:55 AM) Alcohol use  Occasional alcohol use. Caffeine use  Coffee, Tea. No drug use  Tobacco use  Never smoker.  Family History [name]([place]; 04/28/2019 9:55 AM) Diabetes Mellitus  Mother. Heart Disease  Mother. Melanoma  Father.  Other Problems [name](Earl Hogan, CMA; 04/28/2019 9:55 AM) Arthritis  Back Pain  Bladder Problems   Diverticulosis  Gastroesophageal Reflux Disease  Hemorrhoids  Hypercholesterolemia  Kidney Stone     Review of Systems Earl Hogan(Katrinna Travieso MD; 04/28/2019 10:11 AM) General Not Present- Appetite Loss, Chills, Fatigue, Fever, Night Sweats, Weight Gain and Weight Loss. Skin Not Present- Change in Wart/Mole, Dryness, Hives, Jaundice, New Lesions, Non-Healing Wounds, Rash and Ulcer. HEENT Present- Seasonal Allergies and Wears glasses/contact lenses. Not Present- Earache, Hearing Loss, Hoarseness, Nose Bleed, Oral Ulcers, Ringing in the Ears, Sinus Pain, Sore Throat, Visual Disturbances and Yellow Eyes. Respiratory Present- Snoring. Not Present- Bloody sputum, Chronic Cough, Difficulty Breathing and Wheezing. Breast Not Present- Breast Mass, Breast Pain, Nipple Discharge and Skin Changes. Cardiovascular Not Present- Chest Pain, Difficulty Breathing Lying Down, Leg Cramps, Palpitations, Rapid Heart Rate, Shortness of Breath and Swelling of Extremities. Gastrointestinal Present- Abdominal Pain and Gets full quickly at meals. Not Present- Bloating, Bloody Stool, Change in Bowel Habits, Chronic diarrhea, Constipation, Difficulty Swallowing, Excessive gas, Hemorrhoids, Indigestion, Nausea, Rectal Pain and Vomiting. Male Genitourinary Present- Frequency, Urgency and Urine Leakage. Not Present- Blood in Urine, Change in Urinary Stream, Impotence, Nocturia and Painful Urination. Musculoskeletal Present- Back Pain and Joint Pain. Not Present- Joint Stiffness, Muscle Pain, Muscle Weakness and Swelling of Extremities. Neurological Not Present- Decreased Memory, Fainting, Headaches, Numbness, Seizures, Tingling, Tremor, Trouble walking and Weakness. Psychiatric Present- Anxiety. Not Present- Bipolar, Change in Sleep Pattern, Depression, Fearful and Frequent crying. Endocrine Not Present- Cold Intolerance, Excessive Hunger, Hair Changes, Heat Intolerance, Hot flashes and New Diabetes. Hematology Not Present-  Blood Thinners, Easy Bruising, Excessive bleeding, Gland problems, HIV and Persistent Infections. All other systems negative  Vitals [name](Earl Hogan CMA; 04/28/2019 9:56 AM) 04/28/2019 9:55 AM Weight: 270 lb Height: 70in Body Surface Area: 2.37 m Body Mass Index:  38.74 kg/m  Temp.: 98.56F  Pulse: 91 (Regular)  BP: 124/84 (Sitting, Left Arm, Standard)       Physical Exam Earl Filler MD; 04/28/2019 10:13 AM) The physical exam findings are as follows: Note:Constitutional: No acute distress, conversant, appears stated age  Eyes: Anicteric sclerae, moist conjunctiva, no lid lag  Neck: No thyromegaly, trachea midline, no cervical lymphadenopathy  Lungs: Clear to auscultation biilaterally, normal respiratory effot  Cardiovascular: regular rate & rhythm, no murmurs, no peripheal edema, pedal pulses 2+  GI: Soft, no masses or hepatosplenomegaly, non-tender to palpation  MSK: Normal gait, no clubbing cyanosis, edema  Skin: No rashes, palpation reveals normal skin turgor  Psychiatric: Appropriate judgment and insight, oriented to person, place, and time  Abdomen Inspection Hernias - Umbilical hernia - Reducible(At the umbilicus, reducible fat, possibly 1 cm.).    Assessment & Plan Earl Filler MD; 04/28/2019 10:15 AM) UMBILICAL HERNIA WITHOUT OBSTRUCTION AND WITHOUT GANGRENE (K42.9) Impression: Patient is a 61 year patient is a 58 year old male with a history of obesity, chronic back pain, hyperlipidemia, with a history of a recent incarcerated umbilical hernia. Secondary to incarceration on recommend urgent laparoscopic umbilical hernia repair with mesh. All risks and benefits were discussed with the patient to generally include, but not limited to: infection, bleeding, damage to surrounding structures, acute and chronic nerve pain, and recurrence. Alternatives were offered and described. All questions were answered and the patient voiced understanding of the  procedure and wishes to proceed at this point with hernia repair.

## 2019-04-28 NOTE — H&P (Signed)
History of Present Illness (Tymeer Vaquera MD; 04/28/2019 10:12 AM) The patient is a 58 year old male who presents with an umbilical hernia. Referred by: Dr. [name] Chief Complaint: Umbilical hernia  Patient is a 58-year-old male with history of chronic back pain, hyperlipidemia, obesity, who comes in with a history of an incarcerated umbilical hernia. Patient was recently in the ER secondary to abdominal pain, nausea, vomiting. Patient at the time of his ER visit underwent CT scan. I did review this personally. Patient had an incarcerated piece of small bowel within the umbilical hernia. This was reduced at the bedside read patient was discharged thereafter.  In discussion with the patient the hernia has been there for one to 2 years. He states it usually does not give him any pain however at times Become red on the surface. Patient denies any other previous episodes of incarceration or strangulation.    Past Surgical History ([name], CMA; 04/28/2019 9:55 AM) Vasectomy   Diagnostic Studies History ([name], CMA; 04/28/2019 9:55 AM) Colonoscopy  1-5 years ago  Allergies ([name], CMA; 04/28/2019 9:56 AM) Penicillins  Allergies Reconciled   Medication History ([name], CMA; 04/28/2019 9:57 AM) Atorvastatin Calcium (20MG Tablet, Oral) Active. Tamsulosin HCl (0.4MG Capsule, Oral) Active. Meloxicam (15MG Tablet, Oral) Active. Escitalopram Oxalate (10MG Tablet, Oral) Active. [employer] Stool Softener (250MG Capsule, Oral) Active. Medications Reconciled  Social History ([name], CMA; 04/28/2019 9:55 AM) Alcohol use  Occasional alcohol use. Caffeine use  Coffee, Tea. No drug use  Tobacco use  Never smoker.  Family History ([name], CMA; 04/28/2019 9:55 AM) Diabetes Mellitus  Mother. Heart Disease  Mother. Melanoma  Father.  Other Problems ([name], CMA; 04/28/2019 9:55 AM) Arthritis  Back Pain  Bladder Problems   Diverticulosis  Gastroesophageal Reflux Disease  Hemorrhoids  Hypercholesterolemia  Kidney Stone     Review of Systems (Tyquisha Sharps MD; 04/28/2019 10:11 AM) General Not Present- Appetite Loss, Chills, Fatigue, Fever, Night Sweats, Weight Gain and Weight Loss. Skin Not Present- Change in Wart/Mole, Dryness, Hives, Jaundice, New Lesions, Non-Healing Wounds, Rash and Ulcer. HEENT Present- Seasonal Allergies and Wears glasses/contact lenses. Not Present- Earache, Hearing Loss, Hoarseness, Nose Bleed, Oral Ulcers, Ringing in the Ears, Sinus Pain, Sore Throat, Visual Disturbances and Yellow Eyes. Respiratory Present- Snoring. Not Present- Bloody sputum, Chronic Cough, Difficulty Breathing and Wheezing. Breast Not Present- Breast Mass, Breast Pain, Nipple Discharge and Skin Changes. Cardiovascular Not Present- Chest Pain, Difficulty Breathing Lying Down, Leg Cramps, Palpitations, Rapid Heart Rate, Shortness of Breath and Swelling of Extremities. Gastrointestinal Present- Abdominal Pain and Gets full quickly at meals. Not Present- Bloating, Bloody Stool, Change in Bowel Habits, Chronic diarrhea, Constipation, Difficulty Swallowing, Excessive gas, Hemorrhoids, Indigestion, Nausea, Rectal Pain and Vomiting. Male Genitourinary Present- Frequency, Urgency and Urine Leakage. Not Present- Blood in Urine, Change in Urinary Stream, Impotence, Nocturia and Painful Urination. Musculoskeletal Present- Back Pain and Joint Pain. Not Present- Joint Stiffness, Muscle Pain, Muscle Weakness and Swelling of Extremities. Neurological Not Present- Decreased Memory, Fainting, Headaches, Numbness, Seizures, Tingling, Tremor, Trouble walking and Weakness. Psychiatric Present- Anxiety. Not Present- Bipolar, Change in Sleep Pattern, Depression, Fearful and Frequent crying. Endocrine Not Present- Cold Intolerance, Excessive Hunger, Hair Changes, Heat Intolerance, Hot flashes and New Diabetes. Hematology Not Present-  Blood Thinners, Easy Bruising, Excessive bleeding, Gland problems, HIV and Persistent Infections. All other systems negative  Vitals ([name] CMA; 04/28/2019 9:56 AM) 04/28/2019 9:55 AM Weight: 270 lb Height: 70in Body Surface Area: 2.37 m Body Mass Index:   38.74 kg/m  Temp.: 98.56F  Pulse: 91 (Regular)  BP: 124/84 (Sitting, Left Arm, Standard)       Physical Exam Axel Filler MD; 04/28/2019 10:13 AM) The physical exam findings are as follows: Note:Constitutional: No acute distress, conversant, appears stated age  Eyes: Anicteric sclerae, moist conjunctiva, no lid lag  Neck: No thyromegaly, trachea midline, no cervical lymphadenopathy  Lungs: Clear to auscultation biilaterally, normal respiratory effot  Cardiovascular: regular rate & rhythm, no murmurs, no peripheal edema, pedal pulses 2+  GI: Soft, no masses or hepatosplenomegaly, non-tender to palpation  MSK: Normal gait, no clubbing cyanosis, edema  Skin: No rashes, palpation reveals normal skin turgor  Psychiatric: Appropriate judgment and insight, oriented to person, place, and time  Abdomen Inspection Hernias - Umbilical hernia - Reducible(At the umbilicus, reducible fat, possibly 1 cm.).    Assessment & Plan Axel Filler MD; 04/28/2019 10:15 AM) UMBILICAL HERNIA WITHOUT OBSTRUCTION AND WITHOUT GANGRENE (K42.9) Impression: Patient is a 61 year patient is a 58 year old male with a history of obesity, chronic back pain, hyperlipidemia, with a history of a recent incarcerated umbilical hernia. Secondary to incarceration on recommend urgent laparoscopic umbilical hernia repair with mesh. All risks and benefits were discussed with the patient to generally include, but not limited to: infection, bleeding, damage to surrounding structures, acute and chronic nerve pain, and recurrence. Alternatives were offered and described. All questions were answered and the patient voiced understanding of the  procedure and wishes to proceed at this point with hernia repair.

## 2019-05-12 ENCOUNTER — Ambulatory Visit: Payer: Self-pay | Admitting: General Surgery

## 2019-05-12 NOTE — Pre-Procedure Instructions (Signed)
[id number] [address], [place] [address] [place] [address] Phone: [phone] Fax: [phone]      Your procedure is scheduled on Wednesday, June 17th.  Report to [facility] Main Entrance "A" at 8:15 A.M., and check in at the Admitting office.  Call this number if you have problems the morning of surgery:  [phone]  Call [phone] if you have any questions prior to your surgery date Monday-Friday 8am-4pm    Remember:  Do not eat or drink after midnight.   Take these medicines the morning of surgery with A SIP OF WATER  Tamsulosin HCl (FLOMAX) acetaminophen (TYLENOL)-as needed for pain  7 days prior to surgery STOP taking any Aspirin (unless otherwise instructed by your surgeon), Aleve, Naproxen, Ibuprofen, Motrin, Advil, Goody's, BC's, all herbal medications, fish oil, and all vitamins. Including: meloxicam (MOBIC).     The Morning of Surgery  Do not wear jewelry.  Do not wear lotions, powders, or colognes, or deodorant  Do not shave 48 hours prior to surgery.  Men may shave face and neck.  Do not bring valuables to the hospital.  [facility] is not responsible for any belongings or valuables.  If you are a smoker, DO NOT Smoke 24 hours prior to surgery IF you wear a CPAP at night please bring your mask, tubing, and machine the morning of surgery   Remember that you must have someone to transport you home after your surgery, and remain with you for 24 hours if you are discharged the same day.   Contacts, glasses, hearing aids, dentures or bridgework may not be worn into surgery.    Leave your suitcase in the car.  After surgery it may be brought to your room.  For patients admitted to the hospital, discharge time will be determined by your treatment team.  Patients discharged the day of surgery will not be allowed to drive home.    Special instructions:   [facility]- Preparing For Surgery  Before surgery, you can play an important role. Because skin is not sterile, your skin needs to be as free of germs as possible. You can reduce the number of germs on your skin by washing with CHG (chlorahexidine gluconate) Soap before surgery.  CHG is an antiseptic cleaner which kills germs and bonds with the skin to continue killing germs even after washing.    Oral Hygiene is also important to reduce your risk of infection.  Remember - BRUSH YOUR TEETH THE MORNING OF SURGERY WITH YOUR REGULAR TOOTHPASTE  Please do not use if you have an allergy to CHG or antibacterial soaps. If your skin becomes reddened/irritated stop using the CHG.  Do not shave (including legs and underarms) for at least 48 hours prior to first CHG shower. It is OK to shave your face.  Please follow these instructions carefully.   1. Shower the NIGHT BEFORE SURGERY and the MORNING OF SURGERY with CHG Soap.   2. If you chose to wash your hair, wash your hair first as usual with your normal shampoo.  3. After you shampoo, rinse your hair and body thoroughly to remove the shampoo.  4. Use CHG as you would any other liquid soap. You can apply CHG directly to the skin and wash gently with a scrungie or a clean washcloth.   5. Apply the CHG Soap to your body ONLY FROM THE NECK DOWN.  Do not use on open wounds or open sores. Avoid contact with your eyes, ears, mouth and genitals (private parts). Wash Face and genitals (private parts)  with your normal soap.   6. Wash thoroughly, paying special attention to the area where your surgery will be performed.  7. Thoroughly rinse your body with warm water from the neck down.  8. DO NOT shower/wash with your normal soap after using and rinsing off the CHG Soap.  9. Pat yourself dry with a CLEAN TOWEL.  10. Wear CLEAN PAJAMAS to bed the night before surgery, wear comfortable clothes the morning of surgery  11. Place CLEAN SHEETS on your bed the  night of your first shower and DO NOT SLEEP WITH PETS.    Day of Surgery:  Do not apply any deodorants/lotions.  Please wear clean clothes to the hospital/surgery center.   Remember to brush your teeth WITH YOUR REGULAR TOOTHPASTE.   Please read over the following fact sheets that you were given.

## 2019-05-13 ENCOUNTER — Other Ambulatory Visit (HOSPITAL_COMMUNITY)
Admission: RE | Admit: 2019-05-13 | Discharge: 2019-05-13 | Disposition: A | Discharge status: Home / Self Care | Payer: PRIVATE HEALTH INSURANCE | Source: Ambulatory Visit | Attending: General Surgery | Admitting: General Surgery

## 2019-05-13 DIAGNOSIS — Z1159 Encounter for screening for other viral diseases: Secondary | ICD-10-CM | POA: Diagnosis present

## 2019-05-15 ENCOUNTER — Encounter (HOSPITAL_COMMUNITY)
Admission: RE | Admit: 2019-05-15 | Discharge: 2019-05-15 | Disposition: A | Discharge status: Home / Self Care | Payer: PRIVATE HEALTH INSURANCE | Source: Ambulatory Visit | Attending: General Surgery | Admitting: General Surgery

## 2019-05-15 ENCOUNTER — Inpatient Hospital Stay (HOSPITAL_COMMUNITY): Admission: RE | Admit: 2019-05-15 | Payer: PRIVATE HEALTH INSURANCE | Source: Ambulatory Visit

## 2019-05-15 ENCOUNTER — Other Ambulatory Visit: Payer: Self-pay

## 2019-05-15 ENCOUNTER — Encounter (HOSPITAL_COMMUNITY): Payer: Self-pay

## 2019-05-15 DIAGNOSIS — Z8249 Family history of ischemic heart disease and other diseases of the circulatory system: Secondary | ICD-10-CM | POA: Diagnosis not present

## 2019-05-15 DIAGNOSIS — M549 Dorsalgia, unspecified: Secondary | ICD-10-CM | POA: Diagnosis not present

## 2019-05-15 DIAGNOSIS — E669 Obesity, unspecified: Secondary | ICD-10-CM | POA: Diagnosis not present

## 2019-05-15 DIAGNOSIS — K429 Umbilical hernia without obstruction or gangrene: Secondary | ICD-10-CM | POA: Insufficient documentation

## 2019-05-15 DIAGNOSIS — E785 Hyperlipidemia, unspecified: Secondary | ICD-10-CM | POA: Diagnosis not present

## 2019-05-15 DIAGNOSIS — Z880 Allergy status to penicillin: Secondary | ICD-10-CM | POA: Diagnosis not present

## 2019-05-15 DIAGNOSIS — K219 Gastro-esophageal reflux disease without esophagitis: Secondary | ICD-10-CM | POA: Diagnosis not present

## 2019-05-15 DIAGNOSIS — Z833 Family history of diabetes mellitus: Secondary | ICD-10-CM | POA: Diagnosis not present

## 2019-05-15 DIAGNOSIS — M1990 Unspecified osteoarthritis, unspecified site: Secondary | ICD-10-CM | POA: Diagnosis not present

## 2019-05-15 DIAGNOSIS — Z808 Family history of malignant neoplasm of other organs or systems: Secondary | ICD-10-CM | POA: Diagnosis not present

## 2019-05-15 DIAGNOSIS — E7800 Pure hypercholesterolemia, unspecified: Secondary | ICD-10-CM | POA: Diagnosis not present

## 2019-05-15 DIAGNOSIS — Z6838 Body mass index (BMI) 38.0-38.9, adult: Secondary | ICD-10-CM | POA: Diagnosis not present

## 2019-05-15 DIAGNOSIS — Z87442 Personal history of urinary calculi: Secondary | ICD-10-CM | POA: Diagnosis not present

## 2019-05-15 DIAGNOSIS — Z79899 Other long term (current) drug therapy: Secondary | ICD-10-CM | POA: Diagnosis not present

## 2019-05-15 DIAGNOSIS — G8929 Other chronic pain: Secondary | ICD-10-CM | POA: Diagnosis not present

## 2019-05-15 DIAGNOSIS — K5790 Diverticulosis of intestine, part unspecified, without perforation or abscess without bleeding: Secondary | ICD-10-CM | POA: Diagnosis not present

## 2019-05-15 DIAGNOSIS — Z01818 Encounter for other preprocedural examination: Secondary | ICD-10-CM | POA: Insufficient documentation

## 2019-05-15 HISTORY — DX: Unspecified osteoarthritis, unspecified site: M19.90

## 2019-05-15 HISTORY — DX: Umbilical hernia without obstruction or gangrene: K42.9

## 2019-05-15 HISTORY — DX: Depression, unspecified: F32.A

## 2019-05-15 HISTORY — DX: Dorsalgia, unspecified: M54.9

## 2019-05-15 HISTORY — DX: Allergy status to unspecified drugs, medicaments and biological substances: Z88.9

## 2019-05-15 HISTORY — DX: Prediabetes: R73.03

## 2019-05-15 HISTORY — DX: Personal history of urinary calculi: Z87.442

## 2019-05-15 HISTORY — DX: Presence of spectacles and contact lenses: Z97.3

## 2019-05-15 LAB — CBC
HCT: 47.4 % (ref 39.0–52.0)
Hemoglobin: 15.5 g/dL (ref 13.0–17.0)
MCH: 28.5 pg (ref 26.0–34.0)
MCHC: 32.7 g/dL (ref 30.0–36.0)
MCV: 87.1 fL (ref 80.0–100.0)
Platelets: 313 10*3/uL (ref 150–400)
RBC: 5.44 MIL/uL (ref 4.22–5.81)
RDW: 11.6 % (ref 11.5–15.5)
WBC: 6.5 10*3/uL (ref 4.0–10.5)
nRBC: 0 % (ref 0.0–0.2)

## 2019-05-15 LAB — BASIC METABOLIC PANEL
Anion gap: 11 (ref 5–15)
BUN: 12 mg/dL (ref 6–20)
CO2: 20 mmol/L — ABNORMAL LOW (ref 22–32)
Calcium: 9.3 mg/dL (ref 8.9–10.3)
Chloride: 106 mmol/L (ref 98–111)
Creatinine, Ser: 0.89 mg/dL (ref 0.61–1.24)
GFR calc Af Amer: >60 mL/min (ref >60)
GFR calc non Af Amer: >60 mL/min (ref >60)
Glucose, Bld: 102 mg/dL — ABNORMAL HIGH (ref 70–99)
Potassium: 4.4 mmol/L (ref 3.5–5.1)
Sodium: 137 mmol/L (ref 135–145)

## 2019-05-15 LAB — NOVEL CORONAVIRUS, NAA (HOSP ORDER, SEND-OUT TO REF LAB; TAT 18-24 HRS): SARS-CoV-2, NAA: NOT DETECTED

## 2019-05-15 NOTE — Progress Notes (Signed)
Pt denies SOB, chest pain, and being under the care of a cardiologist. Pt denies having an echo but stated that a cardiac cath and stress test were performed > 10 years ago. LOV note, EKG tracings and cardiac studies requested from PCP, Dr. Alessandra Grout at [facility]; nurse awaiting records. Pt denies having a chest x ray within the last year. Pt denies recent labs.   Karoline Caldwell, PA, Anesthesiology, requested that nurse repeat EKG and order BMP.   Pt denies that he and family members tested positive for COVID-19 ( pt had negative test result on 05/13/2019 ; pt reminded to quarantine).   Pt denies that he and family members experienced the following symptoms:  Cough yes/no: No Fever (>100.50F)  yes/no: No Runny nose yes/no: No Sore throat yes/no: No Difficulty breathing/shortness of breath  yes/no: No  Have you or a family member traveled in the last 14 days and where? yes/no: No  Pt reminded that hospital visitation restrictions are in effect and the importance of the restrictions.   Pt verbalized understanding of all pre-op instructions.   Pt chart forwarded to PA, Anesthesiology, for review of previous and current EKG.

## 2019-05-16 MED ORDER — VANCOMYCIN HCL 10 G IV SOLR
1500.0000 mg | INTRAVENOUS | Status: AC
  Administered 2019-05-17: 09:00:00 via INTRAVENOUS
  Administered 2019-05-17: 1500 mg via INTRAVENOUS
  Filled 2019-05-16 (×2): qty 1500

## 2019-05-16 NOTE — Anesthesia Preprocedure Evaluation (Addendum)
Anesthesia Evaluation  Patient identified by MRN, date of birth, ID band Patient awake    Reviewed: Allergy & Precautions, NPO status , Patient's Chart, lab work & pertinent test results  Airway Mallampati: III  TM Distance: >3 FB Neck ROM: Full    Dental  (+) Teeth Intact, Dental Advisory Given   Pulmonary    breath sounds clear to auscultation       Cardiovascular  Rhythm:Regular Rate:Normal     Neuro/Psych    GI/Hepatic   Endo/Other    Renal/GU      Musculoskeletal   Abdominal (+) + obese,   Peds  Hematology   Anesthesia Other Findings   Reproductive/Obstetrics                             Anesthesia Physical Anesthesia Plan  ASA: III  Anesthesia Plan: General   Post-op Pain Management:    Induction: Intravenous, Rapid sequence and Cricoid pressure planned  PONV Risk Score and Plan: Ondansetron and Dexamethasone  Airway Management Planned: Oral ETT  Additional Equipment:   Intra-op Plan:   Post-operative Plan: Extubation in OR  Informed Consent: I have reviewed the patients History and Physical, chart, labs and discussed the procedure including the risks, benefits and alternatives for the proposed anesthesia with the patient or authorized representative who has indicated his/her understanding and acceptance.     Dental advisory given  Plan Discussed with:   Anesthesia Plan Comments: (EKG at ED visit 04/22/19 with computer generated read of afib. However, R-R intervals are consistent and there appear to be subtle p-waves in V2. EKG repeated at PAT appt 05/15/19 shows sinus rhythm rate 68 with one PVC. Pt has no hx of afib. )       Anesthesia Quick Evaluation

## 2019-05-17 ENCOUNTER — Encounter (HOSPITAL_COMMUNITY): Payer: Self-pay | Admitting: Orthopedic Surgery

## 2019-05-17 ENCOUNTER — Encounter (HOSPITAL_COMMUNITY)
Admission: RE | Disposition: A | Discharge status: Home / Self Care | Payer: Self-pay | Source: Home / Self Care | Attending: General Surgery

## 2019-05-17 ENCOUNTER — Ambulatory Visit (HOSPITAL_COMMUNITY): Payer: PRIVATE HEALTH INSURANCE | Admitting: Physician Assistant

## 2019-05-17 ENCOUNTER — Ambulatory Visit (HOSPITAL_COMMUNITY)
Admission: RE | Admit: 2019-05-17 | Discharge: 2019-05-17 | Disposition: A | Discharge status: Home / Self Care | Payer: PRIVATE HEALTH INSURANCE | Attending: General Surgery | Admitting: General Surgery

## 2019-05-17 ENCOUNTER — Ambulatory Visit (HOSPITAL_COMMUNITY): Payer: PRIVATE HEALTH INSURANCE | Admitting: Certified Registered Nurse Anesthetist

## 2019-05-17 ENCOUNTER — Other Ambulatory Visit: Payer: Self-pay

## 2019-05-17 DIAGNOSIS — M549 Dorsalgia, unspecified: Secondary | ICD-10-CM | POA: Insufficient documentation

## 2019-05-17 DIAGNOSIS — Z6838 Body mass index (BMI) 38.0-38.9, adult: Secondary | ICD-10-CM | POA: Insufficient documentation

## 2019-05-17 DIAGNOSIS — Z833 Family history of diabetes mellitus: Secondary | ICD-10-CM | POA: Insufficient documentation

## 2019-05-17 DIAGNOSIS — G8929 Other chronic pain: Secondary | ICD-10-CM | POA: Insufficient documentation

## 2019-05-17 DIAGNOSIS — Z87442 Personal history of urinary calculi: Secondary | ICD-10-CM | POA: Insufficient documentation

## 2019-05-17 DIAGNOSIS — M1990 Unspecified osteoarthritis, unspecified site: Secondary | ICD-10-CM | POA: Insufficient documentation

## 2019-05-17 DIAGNOSIS — K219 Gastro-esophageal reflux disease without esophagitis: Secondary | ICD-10-CM | POA: Insufficient documentation

## 2019-05-17 DIAGNOSIS — K5790 Diverticulosis of intestine, part unspecified, without perforation or abscess without bleeding: Secondary | ICD-10-CM | POA: Insufficient documentation

## 2019-05-17 DIAGNOSIS — E7800 Pure hypercholesterolemia, unspecified: Secondary | ICD-10-CM | POA: Insufficient documentation

## 2019-05-17 DIAGNOSIS — E669 Obesity, unspecified: Secondary | ICD-10-CM | POA: Insufficient documentation

## 2019-05-17 DIAGNOSIS — Z880 Allergy status to penicillin: Secondary | ICD-10-CM | POA: Insufficient documentation

## 2019-05-17 DIAGNOSIS — K429 Umbilical hernia without obstruction or gangrene: Secondary | ICD-10-CM | POA: Insufficient documentation

## 2019-05-17 DIAGNOSIS — Z79899 Other long term (current) drug therapy: Secondary | ICD-10-CM | POA: Insufficient documentation

## 2019-05-17 DIAGNOSIS — Z808 Family history of malignant neoplasm of other organs or systems: Secondary | ICD-10-CM | POA: Insufficient documentation

## 2019-05-17 DIAGNOSIS — Z8249 Family history of ischemic heart disease and other diseases of the circulatory system: Secondary | ICD-10-CM | POA: Insufficient documentation

## 2019-05-17 DIAGNOSIS — E785 Hyperlipidemia, unspecified: Secondary | ICD-10-CM | POA: Insufficient documentation

## 2019-05-17 HISTORY — PX: UMBILICAL HERNIA REPAIR: SHX196

## 2019-05-17 SURGERY — REPAIR, HERNIA, UMBILICAL, LAPAROSCOPIC
Anesthesia: General | Site: Abdomen

## 2019-05-17 MED ORDER — GABAPENTIN 300 MG PO CAPS
300.0000 mg | ORAL_CAPSULE | ORAL | Status: AC
  Administered 2019-05-17: 300 mg via ORAL
  Filled 2019-05-17: qty 1

## 2019-05-17 MED ORDER — GLYCOPYRROLATE PF 0.2 MG/ML IJ SOSY
PREFILLED_SYRINGE | INTRAMUSCULAR | Status: AC
  Filled 2019-05-17: qty 1

## 2019-05-17 MED ORDER — OXYCODONE HCL 5 MG PO TABS
5.0000 mg | ORAL_TABLET | Freq: Once | ORAL | Status: AC | PRN
  Administered 2019-05-17: 5 mg via ORAL

## 2019-05-17 MED ORDER — BUPIVACAINE HCL 0.25 % IJ SOLN
INTRAMUSCULAR | Status: DC | PRN
  Administered 2019-05-17: 7 mL

## 2019-05-17 MED ORDER — MIDAZOLAM HCL 2 MG/2ML IJ SOLN
INTRAMUSCULAR | Status: AC
  Filled 2019-05-17: qty 2

## 2019-05-17 MED ORDER — ROCURONIUM BROMIDE 10 MG/ML (PF) SYRINGE
PREFILLED_SYRINGE | INTRAVENOUS | Status: AC
  Filled 2019-05-17: qty 10

## 2019-05-17 MED ORDER — ONDANSETRON HCL 4 MG/2ML IJ SOLN: 4.0000 mg | Freq: Once | INTRAMUSCULAR | Status: DC | PRN

## 2019-05-17 MED ORDER — SUCCINYLCHOLINE CHLORIDE 200 MG/10ML IV SOSY
PREFILLED_SYRINGE | INTRAVENOUS | Status: AC
  Filled 2019-05-17: qty 10

## 2019-05-17 MED ORDER — GLYCOPYRROLATE PF 0.2 MG/ML IJ SOSY
PREFILLED_SYRINGE | INTRAMUSCULAR | Status: DC | PRN
  Administered 2019-05-17: .2 mg via INTRAVENOUS

## 2019-05-17 MED ORDER — FENTANYL CITRATE (PF) 100 MCG/2ML IJ SOLN
INTRAMUSCULAR | Status: AC
  Filled 2019-05-17: qty 2

## 2019-05-17 MED ORDER — ROCURONIUM BROMIDE 10 MG/ML (PF) SYRINGE
PREFILLED_SYRINGE | INTRAVENOUS | Status: DC | PRN
  Administered 2019-05-17: 30 mg via INTRAVENOUS

## 2019-05-17 MED ORDER — ONDANSETRON HCL 4 MG/2ML IJ SOLN
INTRAMUSCULAR | Status: DC | PRN
  Administered 2019-05-17: 4 mg via INTRAVENOUS

## 2019-05-17 MED ORDER — DEXAMETHASONE SODIUM PHOSPHATE 10 MG/ML IJ SOLN
INTRAMUSCULAR | Status: DC | PRN
  Administered 2019-05-17: 5 mg via INTRAVENOUS

## 2019-05-17 MED ORDER — FENTANYL CITRATE (PF) 250 MCG/5ML IJ SOLN
INTRAMUSCULAR | Status: AC
  Filled 2019-05-17: qty 5

## 2019-05-17 MED ORDER — PHENYLEPHRINE 40 MCG/ML (10ML) SYRINGE FOR IV PUSH (FOR BLOOD PRESSURE SUPPORT)
PREFILLED_SYRINGE | INTRAVENOUS | Status: AC
  Filled 2019-05-17: qty 10

## 2019-05-17 MED ORDER — FENTANYL CITRATE (PF) 250 MCG/5ML IJ SOLN
INTRAMUSCULAR | Status: DC | PRN
  Administered 2019-05-17 (×3): 50 ug via INTRAVENOUS
  Administered 2019-05-17: 100 ug via INTRAVENOUS

## 2019-05-17 MED ORDER — CHLORHEXIDINE GLUCONATE CLOTH 2 % EX PADS: 6.0000 | MEDICATED_PAD | Freq: Once | CUTANEOUS | Status: DC

## 2019-05-17 MED ORDER — FENTANYL CITRATE (PF) 100 MCG/2ML IJ SOLN
25.0000 ug | INTRAMUSCULAR | Status: DC | PRN
  Administered 2019-05-17: 11:00:00 25 ug via INTRAVENOUS

## 2019-05-17 MED ORDER — OXYCODONE HCL 5 MG PO TABS
ORAL_TABLET | ORAL | Status: AC
  Filled 2019-05-17: qty 1

## 2019-05-17 MED ORDER — MIDAZOLAM HCL 2 MG/2ML IJ SOLN
INTRAMUSCULAR | Status: DC | PRN
  Administered 2019-05-17: 2 mg via INTRAVENOUS

## 2019-05-17 MED ORDER — TRAMADOL HCL 50 MG PO TABS: 50.0000 mg | ORAL_TABLET | Freq: Four times a day (QID) | ORAL | 0 refills | Status: AC | PRN

## 2019-05-17 MED ORDER — LACTATED RINGERS IV SOLN
INTRAVENOUS | Status: DC
  Administered 2019-05-17: 09:00:00 via INTRAVENOUS

## 2019-05-17 MED ORDER — CELECOXIB 200 MG PO CAPS
200.0000 mg | ORAL_CAPSULE | ORAL | Status: AC
  Administered 2019-05-17: 200 mg via ORAL
  Filled 2019-05-17: qty 1

## 2019-05-17 MED ORDER — SODIUM CHLORIDE 0.9 % IV SOLN: INTRAVENOUS | Status: DC | PRN

## 2019-05-17 MED ORDER — DEXAMETHASONE SODIUM PHOSPHATE 10 MG/ML IJ SOLN
INTRAMUSCULAR | Status: AC
  Filled 2019-05-17: qty 1

## 2019-05-17 MED ORDER — OXYCODONE HCL 5 MG/5ML PO SOLN: 5.0000 mg | Freq: Once | ORAL | Status: AC | PRN

## 2019-05-17 MED ORDER — BUPIVACAINE HCL (PF) 0.25 % IJ SOLN
INTRAMUSCULAR | Status: AC
  Filled 2019-05-17: qty 30

## 2019-05-17 MED ORDER — SUGAMMADEX SODIUM 200 MG/2ML IV SOLN
INTRAVENOUS | Status: DC | PRN
  Administered 2019-05-17: 245 mg via INTRAVENOUS

## 2019-05-17 MED ORDER — ACETAMINOPHEN 500 MG PO TABS
1000.0000 mg | ORAL_TABLET | ORAL | Status: AC
  Administered 2019-05-17: 1000 mg via ORAL
  Filled 2019-05-17: qty 2

## 2019-05-17 MED ORDER — 0.9 % SODIUM CHLORIDE (POUR BTL) OPTIME
TOPICAL | Status: DC | PRN
  Administered 2019-05-17: 1000 mL

## 2019-05-17 MED ORDER — PROPOFOL 10 MG/ML IV BOLUS
INTRAVENOUS | Status: DC | PRN
  Administered 2019-05-17: 20 mg via INTRAVENOUS
  Administered 2019-05-17: 180 mg via INTRAVENOUS

## 2019-05-17 MED ORDER — ONDANSETRON HCL 4 MG/2ML IJ SOLN
INTRAMUSCULAR | Status: AC
  Filled 2019-05-17: qty 2

## 2019-05-17 MED ORDER — PHENYLEPHRINE 40 MCG/ML (10ML) SYRINGE FOR IV PUSH (FOR BLOOD PRESSURE SUPPORT)
PREFILLED_SYRINGE | INTRAVENOUS | Status: DC | PRN
  Administered 2019-05-17 (×3): 80 ug via INTRAVENOUS

## 2019-05-17 MED ORDER — LIDOCAINE 2% (20 MG/ML) 5 ML SYRINGE
INTRAMUSCULAR | Status: AC
  Filled 2019-05-17: qty 5

## 2019-05-17 MED ORDER — LIDOCAINE 2% (20 MG/ML) 5 ML SYRINGE
INTRAMUSCULAR | Status: DC | PRN
  Administered 2019-05-17: 40 mg via INTRAVENOUS

## 2019-05-17 MED ORDER — SUCCINYLCHOLINE CHLORIDE 200 MG/10ML IV SOSY
PREFILLED_SYRINGE | INTRAVENOUS | Status: DC | PRN
  Administered 2019-05-17: 140 mg via INTRAVENOUS

## 2019-05-17 SURGICAL SUPPLY — 43 items
ADH SKN CLS APL DERMABOND .7 (GAUZE/BANDAGES/DRESSINGS) ×1
BLADE CLIPPER SURG (BLADE) IMPLANT
CANISTER SUCT 3000ML PPV (MISCELLANEOUS) IMPLANT
CHLORAPREP W/TINT 26ML (MISCELLANEOUS) ×2 IMPLANT
COVER SURGICAL LIGHT HANDLE (MISCELLANEOUS) ×2 IMPLANT
COVER WAND RF STERILE (DRAPES) ×2 IMPLANT
DERMABOND ADVANCED (GAUZE/BANDAGES/DRESSINGS) ×1
DERMABOND ADVANCED .7 DNX12 (GAUZE/BANDAGES/DRESSINGS) ×1 IMPLANT
DEVICE SECURE STRAP 25 ABSORB (INSTRUMENTS) ×3 IMPLANT
DEVICE TROCAR PUNCTURE CLOSURE (ENDOMECHANICALS) ×2 IMPLANT
ELECT REM PT RETURN 9FT ADLT (ELECTROSURGICAL) ×2
ELECTRODE REM PT RTRN 9FT ADLT (ELECTROSURGICAL) ×1 IMPLANT
GLOVE BIO SURGEON STRL SZ7.5 (GLOVE) ×2 IMPLANT
GOWN STRL REUS W/ TWL LRG LVL3 (GOWN DISPOSABLE) ×2 IMPLANT
GOWN STRL REUS W/ TWL XL LVL3 (GOWN DISPOSABLE) ×1 IMPLANT
GOWN STRL REUS W/TWL LRG LVL3 (GOWN DISPOSABLE) ×4
GOWN STRL REUS W/TWL XL LVL3 (GOWN DISPOSABLE) ×2
KIT BASIN OR (CUSTOM PROCEDURE TRAY) ×2 IMPLANT
KIT TURNOVER KIT B (KITS) ×2 IMPLANT
MARKER SKIN DUAL TIP RULER LAB (MISCELLANEOUS) ×2 IMPLANT
MESH VENTRALIGHT ST 6IN CRC (Mesh General) ×1 IMPLANT
NDL INSUFFLATION 14GA 120MM (NEEDLE) ×1 IMPLANT
NDL SPNL 22GX3.5 QUINCKE BK (NEEDLE) IMPLANT
NEEDLE INSUFFLATION 14GA 120MM (NEEDLE) ×2 IMPLANT
NEEDLE SPNL 22GX3.5 QUINCKE BK (NEEDLE) IMPLANT
NS IRRIG 1000ML POUR BTL (IV SOLUTION) ×2 IMPLANT
PAD ARMBOARD 7.5X6 YLW CONV (MISCELLANEOUS) ×4 IMPLANT
SCISSORS LAP 5X35 DISP (ENDOMECHANICALS) ×2 IMPLANT
SET IRRIG TUBING LAPAROSCOPIC (IRRIGATION / IRRIGATOR) IMPLANT
SET TUBE SMOKE EVAC HIGH FLOW (TUBING) ×2 IMPLANT
SLEEVE ENDOPATH XCEL 5M (ENDOMECHANICALS) ×2 IMPLANT
SUT CHROMIC 2 0 SH (SUTURE) ×2 IMPLANT
SUT ETHIBOND 0 MO6 C/R (SUTURE) ×1 IMPLANT
SUT MNCRL AB 4-0 PS2 18 (SUTURE) ×2 IMPLANT
SUT NOVA 1 T20/GS 25DT (SUTURE) IMPLANT
SUT PROLENE 2 0 KS (SUTURE) ×4 IMPLANT
TOWEL OR 17X24 6PK STRL BLUE (TOWEL DISPOSABLE) ×2 IMPLANT
TOWEL OR 17X26 10 PK STRL BLUE (TOWEL DISPOSABLE) ×2 IMPLANT
TRAY LAPAROSCOPIC MC (CUSTOM PROCEDURE TRAY) ×2 IMPLANT
TROCAR XCEL BLUNT TIP 100MML (ENDOMECHANICALS) IMPLANT
TROCAR XCEL NON-BLD 11X100MML (ENDOMECHANICALS) IMPLANT
TROCAR XCEL NON-BLD 5MMX100MML (ENDOMECHANICALS) ×2 IMPLANT
WATER STERILE IRR 1000ML POUR (IV SOLUTION) ×2 IMPLANT

## 2019-05-17 NOTE — Discharge Instructions (Signed)
[facility] _______[facility] ° °UMBILICAL  HERNIA REPAIR: POST OP INSTRUCTIONS ° °Always review your discharge instruction sheet given to you by the facility where your surgery was performed. °IF YOU HAVE DISABILITY OR FAMILY LEAVE FORMS, YOU MUST BRING THEM TO THE OFFICE FOR PROCESSING.   °DO NOT GIVE THEM TO YOUR DOCTOR. ° °1. A  prescription for pain medication may be given to you upon discharge.  Take your pain medication as prescribed, if needed.  If narcotic pain medicine is not needed, then you may take acetaminophen (Tylenol) or ibuprofen (Advil) as needed. °2. Take your usually prescribed medications unless otherwise directed. °If you need a refill on your pain medication, please contact your pharmacy.  They will contact our office to request authorization. Prescriptions will not be filled after 5 pm or on week-ends. °3. You should follow a light diet the first 24 hours after arrival home, such as soup and crackers, etc.  Be sure to include lots of fluids daily.  Resume your normal diet the day after surgery. °4.Most patients will experience some swelling and bruising around the umbilicus or in the groin and scrotum.  Ice packs and reclining will help.  Swelling and bruising can take several days to resolve.  °6. It is common to experience some constipation if taking pain medication after surgery.  Increasing fluid intake and taking a stool softener (such as Colace) will usually help or prevent this problem from occurring.  A mild laxative (Milk of Magnesia or Miralax) should be taken according to package directions if there are no bowel movements after 48 hours. °7. Unless discharge instructions indicate otherwise, you may remove your bandages 24-48 hours after surgery, and you may shower at that time.  You may have steri-strips (small skin tapes) in place directly over the incision.  These strips should be left on the skin for 7-10 days.  If your surgeon used skin glue on the incision, you may  shower in 24 hours.  The glue will flake off over the next 2-3 weeks.  Any sutures or staples will be removed at the office during your follow-up visit. °8. ACTIVITIES:  You may resume regular (light) daily activities beginning the next day--such as daily self-care, walking, climbing stairs--gradually increasing activities as tolerated.  You may have sexual intercourse when it is comfortable.  Refrain from any heavy lifting or straining until approved by your doctor. ° °a.You may drive when you are no longer taking prescription pain medication, you can comfortably wear a seatbelt, and you can safely maneuver your car and apply brakes. °b.RETURN TO WORK:   °_____________________________________________ ° °9.You should see your doctor in the office for a follow-up appointment approximately 2-3 weeks after your surgery.  Make sure that you call for this appointment within a day or two after you arrive home to insure a convenient appointment time. °10.OTHER INSTRUCTIONS: _________________________ °   _____________________________________ ° °WHEN TO CALL YOUR DOCTOR: °1. Fever over 101.0 °2. Inability to urinate °3. Nausea and/or vomiting °4. Extreme swelling or bruising °5. Continued bleeding from incision. °6. Increased pain, redness, or drainage from the incision ° °The clinic staff is available to answer your questions during regular business hours.  Please don’t hesitate to call and ask to speak to one of the nurses for clinical concerns.  If you have a medical emergency, go to the nearest emergency room or call 911.  A surgeon from [facility] is always on call at the hospital ° ° °[address], [address], [place], [place]  [address] ? ° [address] [address], [place], [place]   [address] °[phone] ? [phone] ? FAX [phone] °Web site: [email or web address] ° °

## 2019-05-17 NOTE — Anesthesia Procedure Notes (Signed)
Procedure Name: Intubation Date/Time: 05/17/2019 9:41 AM Performed by: Alain Marion, CRNA Pre-anesthesia Checklist: Patient identified, Emergency Drugs available, Suction available and Patient being monitored Patient Re-evaluated:Patient Re-evaluated prior to induction Oxygen Delivery Method: Circle System Utilized Preoxygenation: Pre-oxygenation with 100% oxygen Induction Type: IV induction, Rapid sequence and Cricoid Pressure applied Laryngoscope Size: Miller and 3 Grade View: Grade II Tube type: Oral Tube size: 7.5 mm Number of attempts: 1 Airway Equipment and Method: Stylet and Oral airway Placement Confirmation: ETT inserted through vocal cords under direct vision,  positive ETCO2 and breath sounds checked- equal and bilateral Secured at: 22 cm Tube secured with: Tape Dental Injury: Teeth and Oropharynx as per pre-operative assessment

## 2019-05-17 NOTE — Op Note (Signed)
05/17/2019  10:27 AM  PATIENT:  Earl Hogan  58 y.o. male  PRE-OPERATIVE DIAGNOSIS:  UMBILICAL HERNIA  POST-OPERATIVE DIAGNOSIS:  UMBILICAL HERNIA  PROCEDURE:  Procedure(s): LAPAROSCOPIC UMBILICAL HERNIA REPAIR WITH MESH (N/A)  SURGEON:  Surgeon(s) and Role:    * Ralene Ok, MD - Primary  ANESTHESIA:   local and general  EBL:  10 mL   BLOOD ADMINISTERED:none  DRAINS: none   LOCAL MEDICATIONS USED:  BUPIVICAINE   SPECIMEN:  No Specimen  DISPOSITION OF SPECIMEN:  N/A  COUNTS:  YES  TOURNIQUET:  * No tourniquets in log *  DICTATION: .Dragon Dictation  Details of the procedure:   After the patient was consented patient was taken back to the operating room patient was then placed in supine position bilateral SCDs in place.  The patient was prepped and draped in the usual sterile fashion. After antibiotics were confirmed a timeout was called and all facts were verified. The Veress needle technique was used to insuflate the abdomen at Palmer's point. The abdomen was insufflated to 14 mm mercury. Subsequently a 5 mm trocar was placed a camera inserted there was no injury to any intra-abdominal organs.    There was seen to be a non-incarcerated  1.5 umbilical hernia.  A second camera port was in placed into the left lower quadrant.      I proceeded to reduce the hernia contents.  The hernia sac was dissected out of the hernia and disposed.  The fascia at the hernia was reapproximated using a #0 Novafil x 4.  Once the hernia was cleared away, a Bard Ventralight 15.2cm  mesh was inserted into the abdomen.  The mesh was secured circumferentially with am Securestrap tacker in a double crown fashion.  2-0 prolenes were used at 3:00, 6:00, 9:00, and 12:00 as transfascial sutures using a endoclose decive.    The omentum was brought over the area of the mesh. The pneumoperitoneum was evacuated  & all trocars  were removed. The skin was reapproximated with 4-0  Monocryl sutures in  a subcuticular fashion. The skin was dressed with Dermabond.  The patient was taken to the recovery room in stable condition.  Type of repair -primary suture & mesh  Mesh overlap - 5cm  Placement of mesh -  beneath fascia and into peritoneal cavity   PLAN OF CARE: Discharge to home after PACU  PATIENT DISPOSITION:  PACU - hemodynamically stable.   Delay start of Pharmacological VTE agent (>24hrs) due to surgical blood loss or risk of bleeding: not applicable

## 2019-05-17 NOTE — Interval H&P Note (Signed)
History and Physical Interval Note:  05/17/2019 9:16 AM  Earl Hogan  has presented today for surgery, with the diagnosis of UMBILICAL HERNIA.  The various methods of treatment have been discussed with the patient and family. After consideration of risks, benefits and other options for treatment, the patient has consented to  Procedure(s): [place] (N/A) as a surgical intervention.  The patient's history has been reviewed, patient examined, no change in status, stable for surgery.  I have reviewed the patient's chart and labs.  Questions were answered to the patient's satisfaction.     Ralene Ok

## 2019-05-17 NOTE — Anesthesia Postprocedure Evaluation (Signed)
Anesthesia Post Note  Patient: [name]  Procedure(s) Performed: LAPAROSCOPIC UMBILICAL HERNIA REPAIR WITH MESH (N/A Abdomen)     Patient location during evaluation: PACU Anesthesia Type: General Level of consciousness: awake and alert Pain management: pain level controlled Vital Signs Assessment: post-procedure vital signs reviewed and stable Respiratory status: spontaneous breathing, nonlabored ventilation, respiratory function stable and patient connected to nasal cannula oxygen Cardiovascular status: blood pressure returned to baseline and stable Postop Assessment: no apparent nausea or vomiting Anesthetic complications: no    Last Vitals:  Vitals:   05/17/19 1120 05/17/19 1130  BP: (!) 144/73 (!) 151/79  Pulse: 81   Resp: 18 20  Temp: 36.7 C 36.5 C  SpO2: 95% 94%    Last Pain:  Vitals:   05/17/19 1120  TempSrc:   PainSc: 4                  Hilding Quintanar COKER

## 2019-05-17 NOTE — Transfer of Care (Signed)
Immediate Anesthesia Transfer of Care Note  Patient: Earl Hogan  Procedure(s) Performed: LAPAROSCOPIC UMBILICAL HERNIA REPAIR WITH MESH (N/A Abdomen)  Patient Location: PACU  Anesthesia Type:General  Level of Consciousness: awake, alert  and oriented  Airway & Oxygen Therapy: Patient Spontanous Breathing and Patient connected to face mask oxygen  Post-op Assessment: Report given to RN and Post -op Vital signs reviewed and stable  Post vital signs: Reviewed and stable  Last Vitals:  Vitals Value Taken Time  BP 155/82 05/17/19 1048  Temp    Pulse 86 05/17/19 1052  Resp 22 05/17/19 1052  SpO2 92 % 05/17/19 1052  Vitals shown include unvalidated device data.  Last Pain:  Vitals:   05/17/19 0855  TempSrc:   PainSc: 0-No pain      Patients Stated Pain Goal: 3 (07/62/26 3335)  Complications: No apparent anesthesia complications

## 2019-05-18 ENCOUNTER — Encounter (HOSPITAL_COMMUNITY): Payer: Self-pay | Admitting: General Surgery

## 2019-06-25 IMAGING — CT CT ANGIO CHEST-ABD-PELV FOR DISSECTION W/ AND WO/W CM
2 of 7 series · 13 of 46 positions shown, 15 images · IV contrast (omnipaque)
Comparison: CT abdomen and pelvis dated 03/15/2018.

CLINICAL DATA: RIGHT lower abdominal pain starting approximately 1
hour and a half ago, vomiting, abdominal swelling. On 6 day course
of prednisone for back pain. Chest/back pain, acute, aortic
dissection suspected.

EXAM:
CT ANGIOGRAPHY CHEST, ABDOMEN AND PELVIS
TECHNIQUE: Multidetector CT imaging through the chest, abdomen and pelvis was
performed using the standard protocol during bolus administration of
intravenous contrast. Multiplanar reconstructed images and MIPs were
obtained and reviewed to evaluate the vascular anatomy.
CONTRAST:  100mL OMNIPAQUE IOHEXOL 350 MG/ML SOLN

[Series 8: dissection 2mm · axial · 0.98mm/px · z∈[+688,+1334]mm · 10 of 365 slices shown, 12 images]
[im 21/365  soft-tissue]
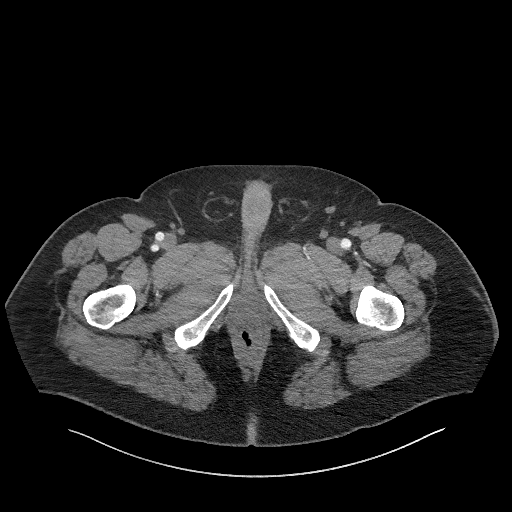
[im 21/365  bone]
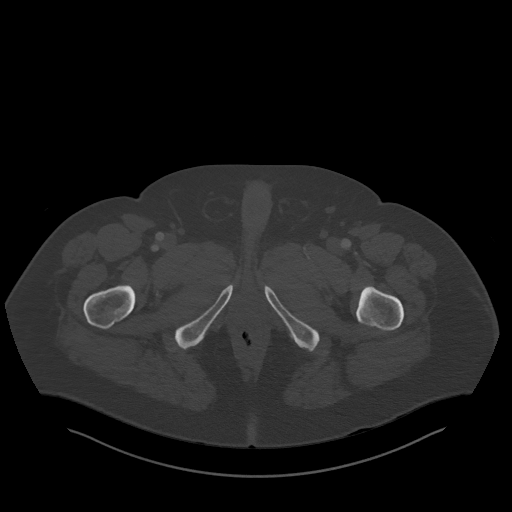
[im 61/365  soft-tissue]
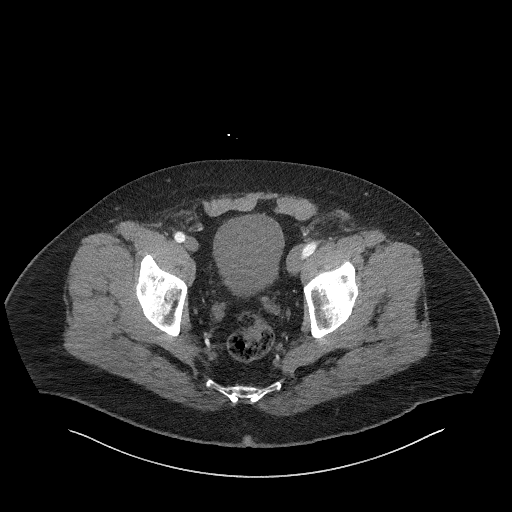
[im 102/365  soft-tissue]
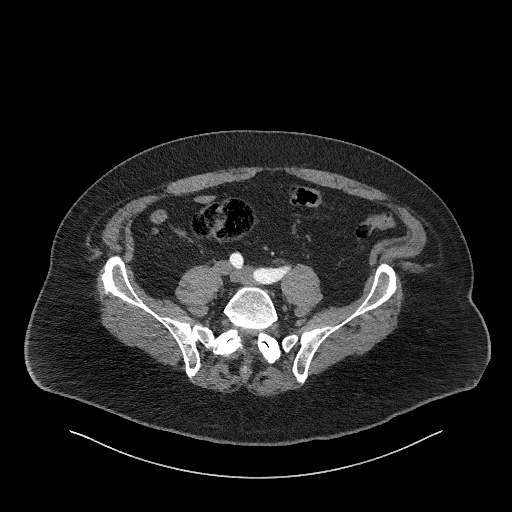
[im 122/365  soft-tissue]
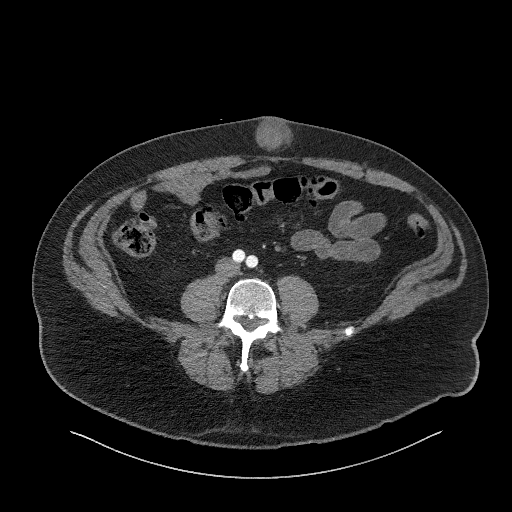
[im 162/365  soft-tissue]
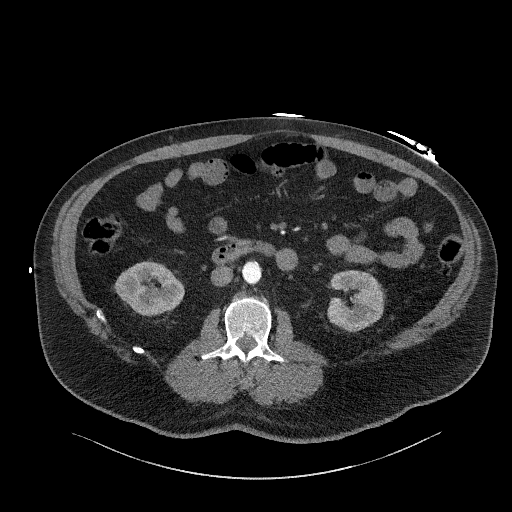
[im 203/365  soft-tissue]
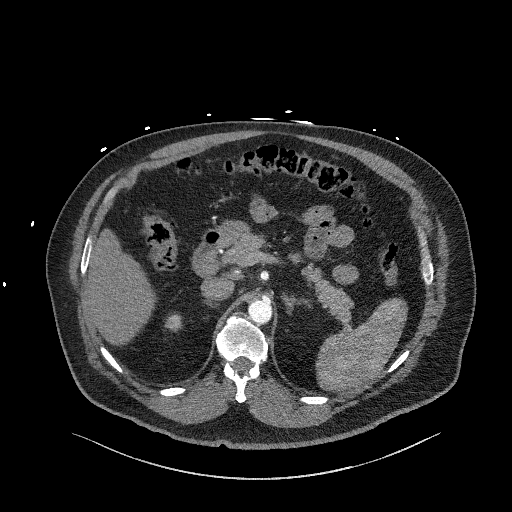
[im 243/365  soft-tissue]
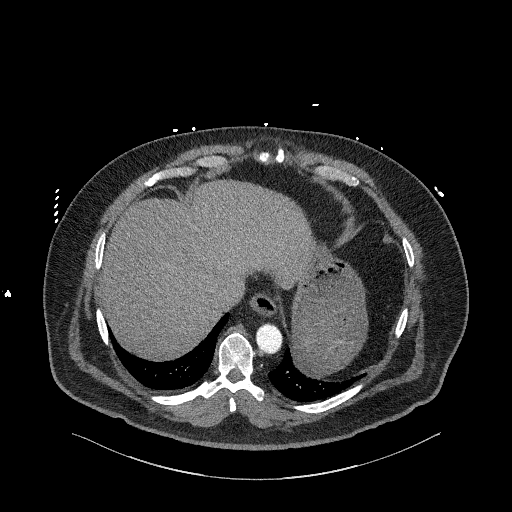
[im 263/365  soft-tissue]
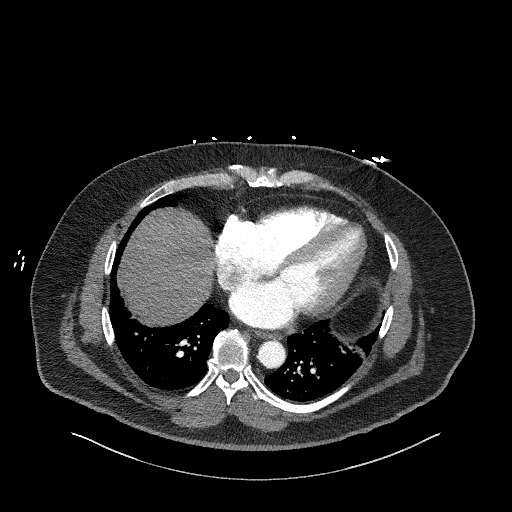
[im 304/365  soft-tissue]
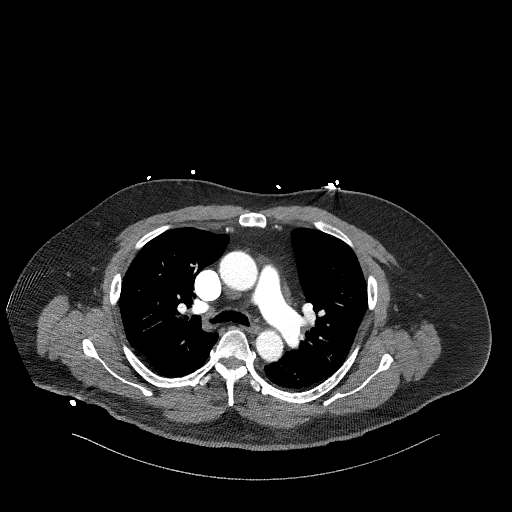
[im 304/365  bone]
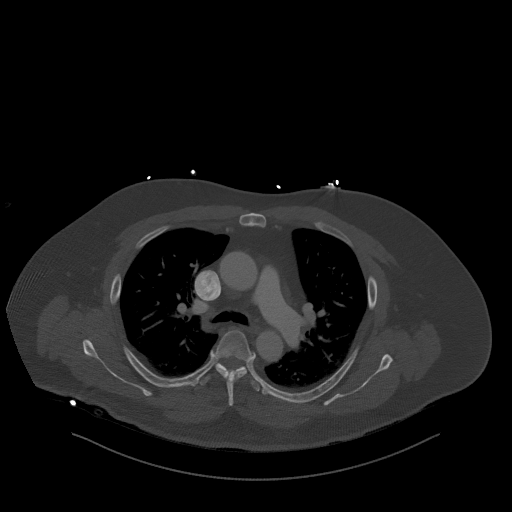
[im 344/365  soft-tissue]
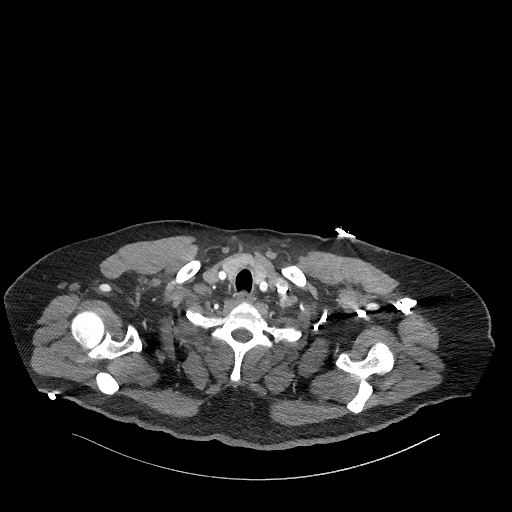

[Series 11: dissection 2mm cor · coronal · 0.92mm/px · 3 of 184 slices shown]
[im 46/184  soft-tissue]
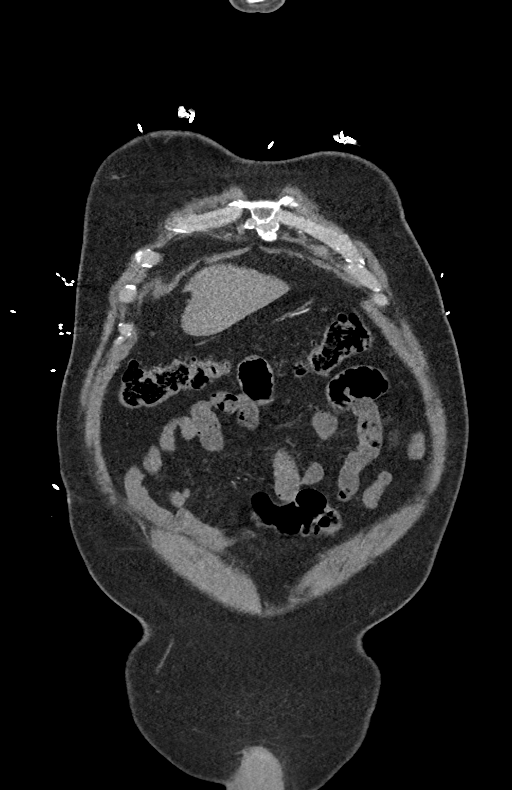
[im 92/184  soft-tissue]
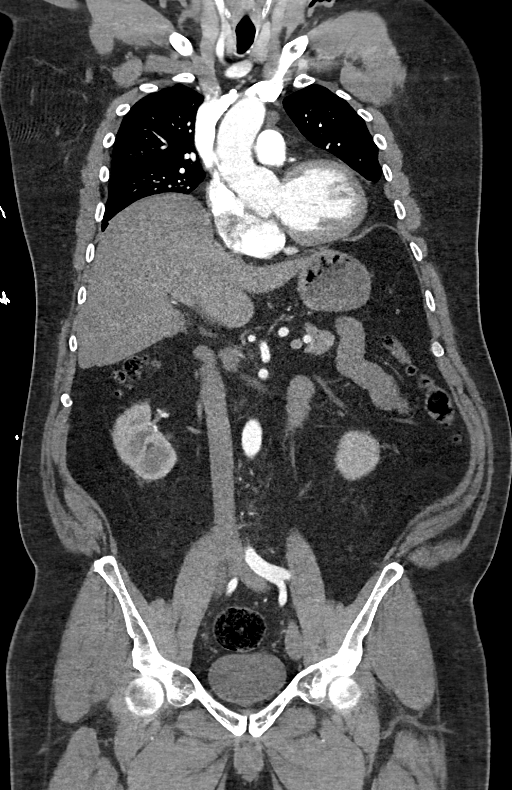
[im 138/184  soft-tissue]
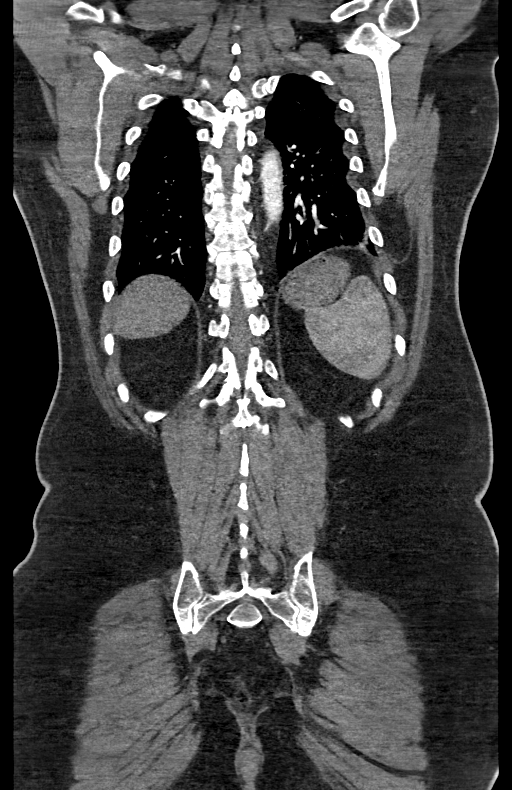

[13 of 46 positions shown; findings below may reference images not displayed]

FINDINGS: CTA CHEST FINDINGS

Cardiovascular: Thoracic aorta is normal in caliber and
configuration. No thoracic aortic aneurysm or evidence of aortic
dissection.

No pericardial effusion. No pulmonary embolism appreciated within
the main, lobar or segmental pulmonary arteries bilaterally.

Mediastinum/Nodes: No mass or enlarged lymph nodes seen within the
mediastinum or perihilar regions. Esophagus is unremarkable. Trachea
and central bronchi are unremarkable.

Lungs/Pleura: Mild scarring/atelectasis at the LEFT lung base. 5 mm
pleural based nodule at the upper margin of the LEFT lung fissure,
suspected atelectasis or fissural lymph node. Lungs otherwise clear.

Musculoskeletal: No acute or suspicious osseous finding.

Review of the MIP images confirms the above findings.

CTA ABDOMEN AND PELVIS FINDINGS

VASCULAR

Aorta: Normal caliber aorta without aneurysm, dissection, vasculitis
or significant stenosis.

Celiac: Patent without evidence of aneurysm, dissection, vasculitis
or significant stenosis.

SMA: Patent without evidence of aneurysm, dissection, vasculitis or
significant stenosis.

Renals: Both renal arteries are patent without evidence of aneurysm,
dissection, vasculitis, fibromuscular dysplasia or significant
stenosis.

IMA: Patent without evidence of aneurysm, dissection, vasculitis or
significant stenosis.

Inflow: Patent without evidence of aneurysm, dissection, vasculitis
or significant stenosis.

Veins: No obvious venous abnormality within the limitations of this
arterial phase study.

Review of the MIP images confirms the above findings.

NON-VASCULAR

Hepatobiliary: No focal liver abnormality is seen. No gallstones,
gallbladder wall thickening, or biliary dilatation.

Pancreas: Unremarkable. No pancreatic ductal dilatation or
surrounding inflammatory changes.

Spleen: Normal in size without focal abnormality.

Adrenals/Urinary Tract: Adrenal glands appear normal. Kidneys are
unremarkable without mass, stone or hydronephrosis. No ureteral or
bladder calculi identified. Bladder is unremarkable, partially
decompressed.

Stomach/Bowel: Scattered diverticulosis throughout the colon but no
focal inflammatory change to suggest acute diverticulitis. No
dilated large or small bowel loops.

Anterior umbilical abdominal wall hernia now contains a portion of
the small bowel (contain fat only on CT abdomen of 03/15/2018). More
proximal small bowel is nondistended with no confirmation of
associated bowel obstruction at this time. Suspect at least mild
thickening/inflammation of the small bowel within the hernia sac.

Lymphatic: No enlarged lymph nodes seen.

Reproductive: Prostate gland is upper normal in size.

Other: No free fluid or abscess collection. No free intraperitoneal
air.

Musculoskeletal: No acute or suspicious osseous finding.

Review of the MIP images confirms the above findings.
IMPRESSION: 1. Umbilical hernia now contains a portion of the small bowel (the
umbilical hernia contained fat only on CT abdomen of 03/15/2018). No
evidence of associated bowel obstruction at this time (more proximal
small bowel is nondistended). Suspect mild thickening/inflammation
of the small bowel within the hernia sac which could indicate
incarceration and impending obstruction. On the previous study, the
opening to the hernia sac measured approximately 2 cm width.
2. Normal thoracic and abdominal aorta. No aortic aneurysm or
dissection.
3. No acute findings within the chest. No pulmonary embolism seen.
Mild scarring/atelectasis at the left lung base. 5 mm pleural based
nodule at the upper margin of the left lung fissure, suspected
atelectasis or fissural lymph node. No follow-up needed if patient
is low-risk. Non-contrast chest CT can be considered in 12 months if
patient is high-risk. This recommendation follows the consensus
statement: Guidelines for Management of Incidental Pulmonary Nodules
Detected on CT Images: From the [HOSPITAL] 7646; Radiology
4. Colonic diverticulosis without evidence of acute diverticulitis.

## 2020-02-03 ENCOUNTER — Other Ambulatory Visit: Payer: Self-pay

## 2020-02-03 ENCOUNTER — Ambulatory Visit: Payer: PRIVATE HEALTH INSURANCE | Attending: Internal Medicine

## 2020-02-03 DIAGNOSIS — Z23 Encounter for immunization: Secondary | ICD-10-CM | POA: Insufficient documentation

## 2020-02-03 NOTE — Progress Notes (Signed)
   Covid-19 Vaccination Clinic  Name:  Earl Hogan    MRN: [id number] DOB: 07/14/1961  02/03/2020  Mr. [name] was observed post Covid-19 immunization for 15 minutes without incident. He was provided with Vaccine Information Sheet and instruction to access the V-Safe system.   Mr. [name] was instructed to call 911 with any severe reactions post vaccine: Marland Kitchen Difficulty breathing  . Swelling of face and throat  . A fast heartbeat  . A bad rash all over body  . Dizziness and weakness   Immunizations Administered    Name Date Dose VIS Date Route   Moderna COVID-19 Vaccine 02/03/2020  3:31 PM 0.5 mL 10/31/2019 Intramuscular   Manufacturer: Moderna   Lot: [id number]   NDC: 98242-998-06

## 2020-03-02 ENCOUNTER — Ambulatory Visit: Payer: PRIVATE HEALTH INSURANCE | Attending: Internal Medicine

## 2020-03-02 DIAGNOSIS — Z23 Encounter for immunization: Secondary | ICD-10-CM

## 2020-03-02 NOTE — Progress Notes (Signed)
   Covid-19 Vaccination Clinic  Name:  LAKOTA MARKGRAF    MRN: [id number] DOB: [birth year]  03/02/2020  Mr. Schwartz was observed post Covid-19 immunization for 15 minutes without incident. He was provided with Vaccine Information Sheet and instruction to access the V-Safe system.   Mr. Davidow was instructed to call 911 with any severe reactions post vaccine: Marland Kitchen Difficulty breathing  . Swelling of face and throat  . A fast heartbeat  . A bad rash all over body  . Dizziness and weakness   Immunizations Administered    Name Date Dose VIS Date Route   Moderna COVID-19 Vaccine 03/02/2020  8:29 AM 0.5 mL 10/31/2019 Intramuscular   Manufacturer: Moderna   Lot: [id number]   NDC: 95583-167-42

## 2021-04-04 ENCOUNTER — Observation Stay (HOSPITAL_COMMUNITY)
Admission: EM | Admit: 2021-04-04 | Discharge: 2021-04-06 | Disposition: A | Discharge status: Home / Self Care | Payer: PRIVATE HEALTH INSURANCE | Attending: Internal Medicine | Admitting: Internal Medicine

## 2021-04-04 ENCOUNTER — Other Ambulatory Visit: Payer: Self-pay

## 2021-04-04 ENCOUNTER — Encounter (HOSPITAL_COMMUNITY): Payer: Self-pay

## 2021-04-04 ENCOUNTER — Emergency Department (HOSPITAL_COMMUNITY): Payer: PRIVATE HEALTH INSURANCE

## 2021-04-04 DIAGNOSIS — Z79899 Other long term (current) drug therapy: Secondary | ICD-10-CM | POA: Diagnosis not present

## 2021-04-04 DIAGNOSIS — H9190 Unspecified hearing loss, unspecified ear: Secondary | ICD-10-CM | POA: Insufficient documentation

## 2021-04-04 DIAGNOSIS — Z20822 Contact with and (suspected) exposure to covid-19: Secondary | ICD-10-CM | POA: Insufficient documentation

## 2021-04-04 DIAGNOSIS — I480 Paroxysmal atrial fibrillation: Principal | ICD-10-CM | POA: Insufficient documentation

## 2021-04-04 DIAGNOSIS — E782 Mixed hyperlipidemia: Secondary | ICD-10-CM | POA: Diagnosis not present

## 2021-04-04 DIAGNOSIS — R0602 Shortness of breath: Secondary | ICD-10-CM | POA: Insufficient documentation

## 2021-04-04 DIAGNOSIS — N400 Enlarged prostate without lower urinary tract symptoms: Secondary | ICD-10-CM | POA: Diagnosis not present

## 2021-04-04 DIAGNOSIS — R0789 Other chest pain: Secondary | ICD-10-CM | POA: Diagnosis present

## 2021-04-04 DIAGNOSIS — E559 Vitamin D deficiency, unspecified: Secondary | ICD-10-CM | POA: Insufficient documentation

## 2021-04-04 DIAGNOSIS — F411 Generalized anxiety disorder: Secondary | ICD-10-CM | POA: Insufficient documentation

## 2021-04-04 DIAGNOSIS — I4891 Unspecified atrial fibrillation: Secondary | ICD-10-CM

## 2021-04-04 DIAGNOSIS — R7303 Prediabetes: Secondary | ICD-10-CM | POA: Diagnosis not present

## 2021-04-04 HISTORY — DX: Mixed hyperlipidemia: E78.2

## 2021-04-04 LAB — BASIC METABOLIC PANEL
Anion gap: 6 (ref 5–15)
BUN: 13 mg/dL (ref 8–23)
CO2: 24 mmol/L (ref 22–32)
Calcium: 9.2 mg/dL (ref 8.9–10.3)
Chloride: 107 mmol/L (ref 98–111)
Creatinine, Ser: 0.97 mg/dL (ref 0.61–1.24)
GFR, Estimated: >60 mL/min (ref >60)
Glucose, Bld: 94 mg/dL (ref 70–99)
Potassium: 4.4 mmol/L (ref 3.5–5.1)
Sodium: 137 mmol/L (ref 135–145)

## 2021-04-04 LAB — CBC
HCT: 48.5 % (ref 39.0–52.0)
Hemoglobin: 15.7 g/dL (ref 13.0–17.0)
MCH: 28.5 pg (ref 26.0–34.0)
MCHC: 32.4 g/dL (ref 30.0–36.0)
MCV: 88 fL (ref 80.0–100.0)
Platelets: 344 10*3/uL (ref 150–400)
RBC: 5.51 MIL/uL (ref 4.22–5.81)
RDW: 11.9 % (ref 11.5–15.5)
WBC: 9 10*3/uL (ref 4.0–10.5)
nRBC: 0 % (ref 0.0–0.2)

## 2021-04-04 LAB — TROPONIN I (HIGH SENSITIVITY)
Troponin I (High Sensitivity): 4 ng/L (ref <18)
Troponin I (High Sensitivity): 4 ng/L (ref <18)

## 2021-04-04 LAB — MAGNESIUM: Magnesium: 2.3 mg/dL (ref 1.7–2.4)

## 2021-04-04 MED ORDER — ATORVASTATIN CALCIUM 10 MG PO TABS
20.0000 mg | ORAL_TABLET | Freq: Every day | ORAL | Status: DC
  Administered 2021-04-05 – 2021-04-06 (×2): 20 mg via ORAL
  Filled 2021-04-04 (×2): qty 2

## 2021-04-04 MED ORDER — ENOXAPARIN SODIUM 40 MG/0.4ML IJ SOSY
40.0000 mg | PREFILLED_SYRINGE | INTRAMUSCULAR | Status: DC
  Administered 2021-04-05: 40 mg via SUBCUTANEOUS
  Filled 2021-04-04: qty 0.4

## 2021-04-04 MED ORDER — ESCITALOPRAM OXALATE 10 MG PO TABS
10.0000 mg | ORAL_TABLET | Freq: Every day | ORAL | Status: DC
  Administered 2021-04-05 (×2): 10 mg via ORAL
  Filled 2021-04-04 (×2): qty 1

## 2021-04-04 MED ORDER — DILTIAZEM LOAD VIA INFUSION
10.0000 mg | Freq: Once | INTRAVENOUS | Status: AC
  Administered 2021-04-04: 10 mg via INTRAVENOUS
  Filled 2021-04-04: qty 10

## 2021-04-04 MED ORDER — ONDANSETRON HCL 4 MG/2ML IJ SOLN: 4.0000 mg | Freq: Four times a day (QID) | INTRAMUSCULAR | Status: DC | PRN

## 2021-04-04 MED ORDER — DILTIAZEM HCL-DEXTROSE 125-5 MG/125ML-% IV SOLN (PREMIX)
5.0000 mg/h | INTRAVENOUS | Status: DC
  Administered 2021-04-04: 5 mg/h via INTRAVENOUS
  Filled 2021-04-04: qty 125

## 2021-04-04 MED ORDER — ACETAMINOPHEN 325 MG PO TABS
650.0000 mg | ORAL_TABLET | ORAL | Status: DC | PRN
  Filled 2021-04-04: qty 2

## 2021-04-04 MED ORDER — MELATONIN 5 MG PO TABS
10.0000 mg | ORAL_TABLET | Freq: Every evening | ORAL | Status: DC | PRN
  Filled 2021-04-04: qty 2

## 2021-04-04 MED ORDER — APIXABAN 5 MG PO TABS
5.0000 mg | ORAL_TABLET | Freq: Two times a day (BID) | ORAL | Status: DC
  Administered 2021-04-04: 5 mg via ORAL
  Filled 2021-04-04: qty 1

## 2021-04-04 MED ORDER — POLYETHYLENE GLYCOL 3350 17 G PO PACK: 17.0000 g | PACK | Freq: Every day | ORAL | Status: DC | PRN

## 2021-04-04 MED ORDER — LORATADINE 10 MG PO TABS
10.0000 mg | ORAL_TABLET | Freq: Every day | ORAL | Status: DC
  Administered 2021-04-05 (×2): 10 mg via ORAL
  Filled 2021-04-04 (×2): qty 1

## 2021-04-04 MED ORDER — TAMSULOSIN HCL 0.4 MG PO CAPS
0.4000 mg | ORAL_CAPSULE | Freq: Every day | ORAL | Status: DC
  Administered 2021-04-05 (×2): 0.4 mg via ORAL
  Filled 2021-04-04 (×2): qty 1

## 2021-04-04 MED ORDER — DILTIAZEM HCL-DEXTROSE 125-5 MG/125ML-% IV SOLN (PREMIX): 5.0000 mg/h | INTRAVENOUS | Status: DC

## 2021-04-04 NOTE — ED Provider Notes (Signed)
[facility] EMERGENCY DEPARTMENT Provider Note   CSN: [id number] Arrival date & time: 04/04/21  1835     History Chief Complaint  Patient presents with  . Chest Pain    Earl Hogan is a 60 y.o. male.  The history is provided by the patient and medical records.   Earl Hogan is a 60 y.o. male who presents to the Emergency Department complaining of chest tightness x 2 weeks. Episodes wax and wane. He went to [facility] walk-in clinic today and had an EKG that was concerning for a fib and he was referred to the emergency department for further evaluation. On ED presentation he is asymptomatic. He did have diaphoresis yesterday. He has mild shortness of breath intermittently. He does report poor sleep. During the episode he feels lightheaded and dizzy. No fevers, leg swelling or pain. He has a history of prediabetes, minimally elevated cholesterol. No prior similar symptoms. He does state that prior to hernia surgery he was told that he might have a fib but on repeat EKG it was decided that he did not.  No tobacco use. Occasional alcohol use. No street drugs.     Past Medical History:  Diagnosis Date  . Back pain   . Depression   . History of kidney stones   . Hx of seasonal allergies   . Hypercholesteremia   . OA (osteoarthritis)   . Pre-diabetes   . Umbilical hernia   . Wears glasses     Patient Active Problem List   Diagnosis Date Noted  . Atrial fibrillation with rapid ventricular response (HCC) 04/04/2021    Past Surgical History:  Procedure Laterality Date  . CARDIAC CATHETERIZATION     prior to 2010  . COLONOSCOPY    . UMBILICAL HERNIA REPAIR N/A 05/17/2019   Procedure: LAPAROSCOPIC UMBILICAL HERNIA REPAIR WITH MESH;  Surgeon: Axel Filler, MD;  Location: [facility] OR;  Service: General;  Laterality: N/A;  . WISDOM TOOTH EXTRACTION    . WRIST SURGERY     left       No family history on file.  Social History   Tobacco Use  . Smoking  status: Never Smoker  . Smokeless tobacco: Never Used  Vaping Use  . Vaping Use: Never used  Substance Use Topics  . Alcohol use: Yes    Comment: occasional  . Drug use: No    Home Medications Prior to Admission medications   Medication Sig Start Date End Date Taking? Authorizing Provider  acetaminophen (TYLENOL) 500 MG tablet Take 500 mg by mouth every 6 (six) hours as needed (back pain.).     [provider]  atorvastatin (LIPITOR) 20 MG tablet Take 20 mg by mouth daily.    [provider]  cetirizine (ZYRTEC) 10 MG tablet Take 10 mg by mouth at bedtime.    [provider]  Cholecalciferol (VITAMIN D3 PO) Take 1 tablet by mouth daily.    [provider]  escitalopram (LEXAPRO) 10 MG tablet Take 10 mg by mouth at bedtime.     [provider]  fexofenadine (ALLEGRA) 180 MG tablet Take 180 mg by mouth at bedtime.    [provider]  ibuprofen (ADVIL) 200 MG tablet Take 400 mg by mouth every 8 (eight) hours as needed (back pain.).    [provider]  meloxicam (MOBIC) 15 MG tablet Take 15 mg by mouth daily as needed (back pain.).  03/02/19   [provider]  tadalafil (CIALIS) 20  MG tablet Take 20 mg by mouth daily as needed for erectile dysfunction. 03/02/19   [provider]  Tamsulosin HCl (FLOMAX) 0.4 MG CAPS Take 1 capsule (0.4 mg total) by mouth daily after breakfast. 07/20/12   Pisciotta, Joni Reining, PA-C    Allergies    Penicillins  Review of Systems   Review of Systems  All other systems reviewed and are negative.   Physical Exam Updated Vital Signs BP 123/83   Pulse 73   Temp 98.1 F (36.7 C) (Oral)   Resp (!) 23   SpO2 98%   Physical Exam Vitals and nursing note reviewed.  Constitutional:      Appearance: He is well-developed.  HENT:     Head: Normocephalic and atraumatic.  Cardiovascular:     Rate and Rhythm: Normal rate and regular rhythm.     Heart sounds: No murmur  heard.   Pulmonary:     Effort: Pulmonary effort is normal. No respiratory distress.     Breath sounds: Normal breath sounds.  Abdominal:     Palpations: Abdomen is soft.     Tenderness: There is no abdominal tenderness. There is no guarding or rebound.  Musculoskeletal:        General: No swelling or tenderness.  Skin:    General: Skin is warm and dry.  Neurological:     Mental Status: He is alert and oriented to person, place, and time.  Psychiatric:        Behavior: Behavior normal.     ED Results / Procedures / Treatments   Labs (all labs ordered are listed, but only abnormal results are displayed) Labs Reviewed  RESP PANEL BY RT-PCR (FLU A&B, COVID) ARPGX2  BASIC METABOLIC PANEL  CBC  MAGNESIUM  TROPONIN I (HIGH SENSITIVITY)  TROPONIN I (HIGH SENSITIVITY)    EKG EKG Interpretation  Date/Time:  Friday Apr 04 2021 20:59:36 EDT Ventricular Rate:  151 PR Interval:    QRS Duration: 125 QT Interval:  335 QTC Calculation: 542 R Axis:   45 Text Interpretation: Atrial fibrillation Nonspecific intraventricular conduction delay Borderline repolarization abnormality Confirmed by Tilden Fossa [phone]) on 04/04/2021 9:14:49 PM   Radiology DG Chest 2 View  Result Date: 04/04/2021 CLINICAL DATA:  Midsternal chest pain/pressure with anxiety for 2 days. EXAM: CHEST - 2 VIEW COMPARISON:  Chest CTA 04/22/2019.  No prior chest radiographs. FINDINGS: The lateral view was repeated. The heart size and mediastinal contours are normal. The lungs are clear. There is no pleural effusion or pneumothorax. No acute osseous findings are identified. Stable mildly prominent fat along the left ventricular apex. IMPRESSION: No active cardiopulmonary process. Electronically Signed   By: Carey Bullocks M.D.   On: 04/04/2021 19:30    Procedures Procedures  CRITICAL CARE Performed by: Tilden Fossa   Total critical care time: 35 minutes  Critical care time was exclusive of separately  billable procedures and treating other patients.  Critical care was necessary to treat or prevent imminent or life-threatening deterioration.  Critical care was time spent personally by me on the following activities: development of treatment plan with patient and/or surrogate as well as nursing, discussions with consultants, evaluation of patient's response to treatment, examination of patient, obtaining history from patient or surrogate, ordering and performing treatments and interventions, ordering and review of laboratory studies, ordering and review of radiographic studies, pulse oximetry and re-evaluation of patient's condition.  Medications Ordered in ED Medications  apixaban (ELIQUIS) tablet 5 mg (has no administration in time range)  diltiazem (CARDIZEM) 1 mg/mL load via infusion 10 mg (has no administration in time range)    And  diltiazem (CARDIZEM) 125 mg in dextrose 5% 125 mL (1 mg/mL) infusion (has no administration in time range)    ED Course  I have reviewed the triage vital signs and the nursing notes.  Pertinent labs & imaging results that were available during my care of the patient were reviewed by me and considered in my medical decision making (see chart for details).    MDM Rules/Calculators/A&P                          Patient here for evaluation of intermittent episodes of chest tightness for the last two weeks. He was found to be in a fib with RVR at the walk-in clinic and was referred to the emergency department. EKG on ED arrival is with rapid a fib versus flutter, rates to the 150s. Will start on Cardizem drip for rate control, eloquence for anticoagulation. Discussed with patient findings of studies and recommendation for admission and he is in agreement with treatment plan. Hospitalist consulted for admission. Final Clinical Impression(s) / ED Diagnoses Final diagnoses:  Atrial fibrillation with rapid ventricular response [facility])    Rx / DC Orders ED  Discharge Orders    None       Tilden Fossa, MD 04/04/21 2138

## 2021-04-04 NOTE — ED Provider Notes (Signed)
Emergency Medicine Provider Triage Evaluation Note  Earl Hogan , a 60 y.o. male  was evaluated in triage.  Pt complains of chest pain.  Patient has been having intermittent chest tightness, had initially attributed this to anxiety, but when he got evaluated at walk-in clinic today and was found to be in A. fib without RVR.  No prior diagnosis of A. fib.  Currently does not have chest pain.  Review of Systems  Positive: Chest pain, anxiety Negative: Fever, SOB  Physical Exam  BP (!) 132/95 (BP Location: Left Arm)   Pulse 77   Temp 98.1 F (36.7 C) (Oral)   Resp 20   SpO2 99%  Gen:   Awake, no distress   Resp:  Normal effort  Cardiac: Regular, irregular rhythm MSK:   Moves extremities without difficulty  Other:    Medical Decision Making  Medically screening exam initiated at 7:10 PM.  Appropriate orders placed.  Earl Hogan was informed that the remainder of the evaluation will be completed by another provider, this initial triage assessment does not replace that evaluation, and the importance of remaining in the ED until their evaluation is complete.     Dartha Lodge, PA-C 04/04/21 1914    Gerhard Munch, MD 04/04/21 2007

## 2021-04-04 NOTE — H&P (Signed)
History and Physical    Earl Hogan VWU:[id number] DOB: 09/22/1961 DOA: 04/04/2021  PCP: Mila PalmerWolters, Sharon, MD  Patient coming from: Home   Chief Complaint:  Chief Complaint  Patient presents with  . Chest Pain     HPI:    60 year old male with past medical history of thrombocytic hyperplasia, depression, hyperlipidemia who presents to [facility] department with complaints of chest discomfort and anxiousness.  Patient explains that approximately 2 weeks ago he noticed that he was having feelings of anxiety.  He additionally felt waxing and waning bouts of midsternal chest pressure.  Patient denies any associated palpitations, shortness of breath, cough, leg swelling, paroxysmal nocturnal dyspnea, pillow orthopnea, fevers or sick contacts.  Patient denies illicit drug use or alcohol abuse.  Over the following 2 weeks, patient's symptoms of anxiety and waxing waning chest discomfort continued to persist.  Patient eventually ended up presenting to [facility] walk-in clinic at the direction of his primary care provider.  Upon evaluation there patient was found to be in rapid atrial fibrillation.  Patient was then directed to go to [facility] emergency department for evaluation.  Upon evaluation in the emergency department patient was confirmed to be in rapid atrial fibrillation with heart rates in excess of 150 bpm on arrival.  Initial troponin was found to be unremarkable.  Patient was initiated on a diltiazem infusion with some improvement in heart rate to the 120s to 130s.  The hospitalist group was then called to assist the patient for mission the hospital.   Review of Systems:   Review of Systems  Constitutional: Positive for malaise/fatigue.  Psychiatric/Behavioral: The patient is nervous/anxious.   All other systems reviewed and are negative.   Past Medical History:  Diagnosis Date  . Back pain   . Depression   . History of kidney stones    . Hx of seasonal allergies   . Hypercholesteremia   . Mixed hyperlipidemia 04/04/2021  . OA (osteoarthritis)   . Pre-diabetes   . Umbilical hernia   . Wears glasses     Past Surgical History:  Procedure Laterality Date  . CARDIAC CATHETERIZATION     prior to 2010  . COLONOSCOPY    . UMBILICAL HERNIA REPAIR N/A 05/17/2019   Procedure: LAPAROSCOPIC UMBILICAL HERNIA REPAIR WITH MESH;  Surgeon: Axel Filleramirez, Armando, MD;  [facility];  Service: General;  Laterality: N/A;  . WISDOM TOOTH EXTRACTION    . WRIST SURGERY     left     reports that he has never smoked. He has never used smokeless tobacco. He reports current alcohol use. He reports that he does not use drugs.  Allergies  Allergen Reactions  . Penicillins Other (See Comments)    Unknown childhood Did it involve swelling of the face/tongue/throat, SOB, or low BP? Unknown Did it involve sudden or severe rash/hives, skin peeling, or any reaction on the inside of your mouth or nose? Unknown Did you need to seek medical attention at a hospital or doctor's office? Unknown When did it last happen? Childhood reaction. If all above answers are "NO", may proceed with cephalosporin use.     History reviewed. No pertinent family history.   Prior to Admission medications   Medication Sig Start Date End Date Taking? Authorizing Provider  acetaminophen (TYLENOL) 500 MG tablet Take 500 mg by mouth every 6 (six) hours as needed (back pain.).    Yes [provider]  atorvastatin (LIPITOR) 20 MG tablet Take 20 mg  by mouth daily.   Yes [provider]  cetirizine (ZYRTEC) 10 MG tablet Take 10 mg by mouth at bedtime.   Yes [provider]  Cholecalciferol (VITAMIN D3 PO) Take 1 tablet by mouth daily.   Yes [provider]  escitalopram (LEXAPRO) 10 MG tablet Take 10 mg by mouth at bedtime.    Yes [provider]  fexofenadine (ALLEGRA) 180 MG tablet Take 180 mg by mouth daily as needed for  allergies.   Yes [provider]  ibuprofen (ADVIL) 200 MG tablet Take 400 mg by mouth every 8 (eight) hours as needed (back pain.).   Yes [provider]  meloxicam (MOBIC) 15 MG tablet Take 15 mg by mouth every other day. 03/02/19  Yes [provider]  tadalafil (CIALIS) 20 MG tablet Take 20 mg by mouth daily as needed for erectile dysfunction. 03/02/19  Yes [provider]  Tamsulosin HCl (FLOMAX) 0.4 MG CAPS Take 1 capsule (0.4 mg total) by mouth daily after breakfast. Patient taking differently: Take 0.4 mg by mouth at bedtime. 07/20/12  Yes PisciottaJoni Reining, PA-C    Physical Exam: Vitals:   04/04/21 2329 04/04/21 2354 04/04/21 2355 04/05/21 0025  BP:  123/82  117/83  Pulse:  67 75 94  Resp:  17 16 (!) 26  Temp: 98.5 F (36.9 C)  98.1 F (36.7 C)   TempSrc: Oral  Oral   SpO2:  97% 97% 97%    Constitutional: Awake alert and oriented x3, no associated distress.   Skin: no rashes, no lesions, good skin turgor noted. Eyes: Pupils are equally reactive to light.  No evidence of scleral icterus or conjunctival pallor.  ENMT: Moist mucous membranes noted.  Posterior pharynx clear of any exudate or lesions.   Neck: normal, supple, no masses, no thyromegaly.  No evidence of jugular venous distension.   Respiratory: clear to auscultation bilaterally, no wheezing, no crackles. Normal respiratory effort. No accessory muscle use.  Cardiovascular: Tachycardic and irregularly irregular, no murmurs / rubs / gallops. No extremity edema. 2+ pedal pulses. No carotid bruits.  Chest:   Nontender without crepitus or deformity.   Back:   Nontender without crepitus or deformity. Abdomen: Abdomen is soft and nontender.  No evidence of intra-abdominal masses.  Positive bowel sounds noted in all quadrants.   Musculoskeletal: No joint deformity upper and lower extremities. Good ROM, no contractures. Normal muscle tone.  Neurologic: CN 2-12 grossly intact. Sensation intact.   Patient moving all 4 extremities spontaneously.  Patient is following all commands.  Patient is responsive to verbal stimuli.   Psychiatric: Patient exhibits normal mood with appropriate affect.  Patient seems to possess insight as to their current situation.     Labs on Admission: I have personally reviewed following labs and imaging studies -   CBC: Recent Labs  Lab 04/04/21 1917  WBC 9.0  HGB 15.7  HCT 48.5  MCV 88.0  PLT 344   Basic Metabolic Panel: Recent Labs  Lab 04/04/21 1917 04/04/21 2038  NA 137  --   K 4.4  --   CL 107  --   CO2 24  --   GLUCOSE 94  --   BUN 13  --   CREATININE 0.97  --   CALCIUM 9.2  --   MG  --  2.3   GFR: CrCl cannot be calculated (Unknown ideal weight.). Liver Function Tests: No results for input(s): AST, ALT, ALKPHOS, BILITOT, PROT, ALBUMIN in the last 168 hours.  No results for input(s): LIPASE, AMYLASE in the last 168 hours. No results for input(s): AMMONIA in the last 168 hours. Coagulation Profile: No results for input(s): INR, PROTIME in the last 168 hours. Cardiac Enzymes: No results for input(s): CKTOTAL, CKMB, CKMBINDEX, TROPONINI in the last 168 hours. BNP (last 3 results) No results for input(s): PROBNP in the last 8760 hours. HbA1C: No results for input(s): HGBA1C in the last 72 hours. CBG: No results for input(s): GLUCAP in the last 168 hours. Lipid Profile: No results for input(s): CHOL, HDL, LDLCALC, TRIG, CHOLHDL, LDLDIRECT in the last 72 hours. Thyroid Function Tests: No results for input(s): TSH, T4TOTAL, FREET4, T3FREE, THYROIDAB in the last 72 hours. Anemia Panel: No results for input(s): VITAMINB12, FOLATE, FERRITIN, TIBC, IRON, RETICCTPCT in the last 72 hours. Urine analysis:    Component Value Date/Time   COLORURINE YELLOW 07/20/2012 0536   APPEARANCEUR CLEAR 07/20/2012 0536   LABSPEC 1.021 07/20/2012 0536   PHURINE 6.0 07/20/2012 0536   GLUCOSEU NEGATIVE 07/20/2012 0536   HGBUR NEGATIVE 07/20/2012 0536    BILIRUBINUR NEGATIVE 07/20/2012 0536   KETONESUR NEGATIVE 07/20/2012 0536   PROTEINUR NEGATIVE 07/20/2012 0536   UROBILINOGEN 1.0 07/20/2012 0536   NITRITE NEGATIVE 07/20/2012 0536   LEUKOCYTESUR NEGATIVE 07/20/2012 0536    Radiological Exams on Admission - Personally Reviewed: DG Chest 2 View  Result Date: 04/04/2021 CLINICAL DATA:  Midsternal chest pain/pressure with anxiety for 2 days. EXAM: CHEST - 2 VIEW COMPARISON:  Chest CTA 04/22/2019.  No prior chest radiographs. FINDINGS: The lateral view was repeated. The heart size and mediastinal contours are normal. The lungs are clear. There is no pleural effusion or pneumothorax. No acute osseous findings are identified. Stable mildly prominent fat along the left ventricular apex. IMPRESSION: No active cardiopulmonary process. Electronically Signed   By: Carey Bullocks M.D.   On: 04/04/2021 19:30    EKG: Personally reviewed.  Rhythm is atrial flutter with heart rate of 116 bpm.  No dynamic ST segment changes appreciated.  Assessment/Plan Principal Problem:   Atrial fibrillation and atrial flutter with rapid ventricular response (HCC)   Patient presenting with nearly 2-week history of generalized malaise, feelings of anxiety and intermittent chest pressure  Patient was notably in rapid atrial fibrillation on arrival with heart rates in excess of 150 bpm.  Upon my evaluation the patient he seems to be transitioned to rapid atrial flutter  While diltiazem infusions are typically not optimal in patients where the ejection fraction is unknown, patient is already been initiated on this infusion by the emergency department staff and seems to be tolerating it well.  Nursing is to continue to adjust this medication to achieve average heart rates of less than 90 bpm as blood pressure tolerate  Placing patient and close observation in the progressive unit  Monitoring patient closely on telemetry  To determine the etiology of the patient's  rapid atrial fibrillation obtaining serial troponins, TSH, electrolytes including magnesium and D-dimer.  Obtaining echocardiogram in the morning  Based on how quickly the patient can achieve rate control and patient symptoms will consider possible cardiology consultation in the morning.  CHA2DS2-VASc score is currently 0.  While ER provider did provide patient with 1 dose of Eliquis in the emergency department considering the score of 0 this is not indicated at this time.  Active Problems:   Mixed hyperlipidemia   Continue home regimen of Lipitor    Benign prostatic hyperplasia   Continue home regimen of Flomax    Generalized  anxiety disorder   Continue home regimen of Lexapro.   Code Status:  Full code Family Communication: Deferred  Status is: Observation  The patient remains OBS appropriate and will d/c before 2 midnights.  Dispo: The patient is from: Home              Anticipated d/c is to: Home              Patient currently is not medically stable to d/c.   Difficult to place patient No        Marinda Elk MD [facility] Pager [phone]  If 7PM-7AM, please contact night-coverage [email or web address] Use universal  password for that web site. If you do not have the password, please call the hospital operator.  04/05/2021, 12:36 AM

## 2021-04-04 NOTE — ED Triage Notes (Signed)
Pt presents with mid-sternum chest pressure and anxiety x2 days. Pt reports he has been extremely anxious, trouble sleeping, cold sweats at unusual times, feeling faint with standing. PCP sent him to [facility] walk in clinic. EKG shows A-fib, pt denies any hx of same

## 2021-04-05 ENCOUNTER — Encounter (HOSPITAL_COMMUNITY): Payer: Self-pay | Admitting: Internal Medicine

## 2021-04-05 ENCOUNTER — Observation Stay (HOSPITAL_BASED_OUTPATIENT_CLINIC_OR_DEPARTMENT_OTHER): Payer: PRIVATE HEALTH INSURANCE

## 2021-04-05 DIAGNOSIS — I483 Typical atrial flutter: Secondary | ICD-10-CM

## 2021-04-05 DIAGNOSIS — I4891 Unspecified atrial fibrillation: Secondary | ICD-10-CM | POA: Diagnosis not present

## 2021-04-05 LAB — COMPREHENSIVE METABOLIC PANEL
ALT: 30 U/L (ref 0–44)
AST: 23 U/L (ref 15–41)
Albumin: 4.1 g/dL (ref 3.5–5.0)
Alkaline Phosphatase: 71 U/L (ref 38–126)
Anion gap: 8 (ref 5–15)
BUN: 13 mg/dL (ref 8–23)
CO2: 24 mmol/L (ref 22–32)
Calcium: 9 mg/dL (ref 8.9–10.3)
Chloride: 106 mmol/L (ref 98–111)
Creatinine, Ser: 1.08 mg/dL (ref 0.61–1.24)
GFR, Estimated: >60 mL/min (ref >60)
Glucose, Bld: 116 mg/dL — ABNORMAL HIGH (ref 70–99)
Potassium: 4.1 mmol/L (ref 3.5–5.1)
Sodium: 138 mmol/L (ref 135–145)
Total Bilirubin: 0.9 mg/dL (ref 0.3–1.2)
Total Protein: 6.4 g/dL — ABNORMAL LOW (ref 6.5–8.1)

## 2021-04-05 LAB — RESP PANEL BY RT-PCR (FLU A&B, COVID) ARPGX2
Influenza A by PCR: NEGATIVE
Influenza B by PCR: NEGATIVE
SARS Coronavirus 2 by RT PCR: NEGATIVE

## 2021-04-05 LAB — CBC WITH DIFFERENTIAL/PLATELET
Abs Immature Granulocytes: 0.01 10*3/uL (ref 0.00–0.07)
Basophils Absolute: 0.1 10*3/uL (ref 0.0–0.1)
Basophils Relative: 1 %
Eosinophils Absolute: 0.2 10*3/uL (ref 0.0–0.5)
Eosinophils Relative: 3 %
HCT: 46.3 % (ref 39.0–52.0)
Hemoglobin: 15.3 g/dL (ref 13.0–17.0)
Immature Granulocytes: 0 %
Lymphocytes Relative: 37 %
Lymphs Abs: 3 10*3/uL (ref 0.7–4.0)
MCH: 28.7 pg (ref 26.0–34.0)
MCHC: 33 g/dL (ref 30.0–36.0)
MCV: 86.9 fL (ref 80.0–100.0)
Monocytes Absolute: 0.7 10*3/uL (ref 0.1–1.0)
Monocytes Relative: 9 %
Neutro Abs: 4 10*3/uL (ref 1.7–7.7)
Neutrophils Relative %: 50 %
Platelets: 301 10*3/uL (ref 150–400)
RBC: 5.33 MIL/uL (ref 4.22–5.81)
RDW: 11.9 % (ref 11.5–15.5)
WBC: 8 10*3/uL (ref 4.0–10.5)
nRBC: 0 % (ref 0.0–0.2)

## 2021-04-05 LAB — MAGNESIUM: Magnesium: 2.2 mg/dL (ref 1.7–2.4)

## 2021-04-05 LAB — ECHOCARDIOGRAM COMPLETE
Height: 70 in
S' Lateral: 3.6 cm
Weight: 4590.4 oz

## 2021-04-05 LAB — APTT: aPTT: 30 seconds (ref 24–36)

## 2021-04-05 LAB — HEMOGLOBIN A1C
Hgb A1c MFr Bld: 5.6 % (ref 4.8–5.6)
Mean Plasma Glucose: 114.02 mg/dL

## 2021-04-05 LAB — PROTIME-INR
INR: 1.1 (ref 0.8–1.2)
Prothrombin Time: 14.5 seconds (ref 11.4–15.2)

## 2021-04-05 LAB — RAPID URINE DRUG SCREEN, HOSP PERFORMED
Amphetamines: NOT DETECTED
Barbiturates: NOT DETECTED
Benzodiazepines: NOT DETECTED
Cocaine: NOT DETECTED
Opiates: NOT DETECTED
Tetrahydrocannabinol: NOT DETECTED

## 2021-04-05 LAB — D-DIMER, QUANTITATIVE: D-Dimer, Quant: <0.27 ug/mL-FEU (ref 0.00–0.50)

## 2021-04-05 LAB — HIV ANTIBODY (ROUTINE TESTING W REFLEX): HIV Screen 4th Generation wRfx: NONREACTIVE

## 2021-04-05 LAB — TSH: TSH: 2.271 u[IU]/mL (ref 0.350–4.500)

## 2021-04-05 LAB — BRAIN NATRIURETIC PEPTIDE: B Natriuretic Peptide: 145.5 pg/mL — ABNORMAL HIGH (ref 0.0–100.0)

## 2021-04-05 MED ORDER — METOPROLOL TARTRATE 5 MG/5ML IV SOLN
5.0000 mg | Freq: Once | INTRAVENOUS | Status: AC
  Administered 2021-04-05: 5 mg via INTRAVENOUS
  Filled 2021-04-05: qty 5

## 2021-04-05 MED ORDER — METOPROLOL TARTRATE 12.5 MG HALF TABLET
12.5000 mg | ORAL_TABLET | Freq: Two times a day (BID) | ORAL | Status: DC
  Administered 2021-04-05: 12.5 mg via ORAL
  Filled 2021-04-05: qty 1

## 2021-04-05 MED ORDER — METOPROLOL SUCCINATE ER 50 MG PO TB24
50.0000 mg | ORAL_TABLET | Freq: Every day | ORAL | Status: DC
  Administered 2021-04-05 – 2021-04-06 (×2): 50 mg via ORAL
  Filled 2021-04-05 (×2): qty 1

## 2021-04-05 MED ORDER — APIXABAN 5 MG PO TABS
5.0000 mg | ORAL_TABLET | Freq: Two times a day (BID) | ORAL | Status: DC
  Administered 2021-04-05 – 2021-04-06 (×3): 5 mg via ORAL
  Filled 2021-04-05 (×3): qty 1

## 2021-04-05 MED ORDER — METOPROLOL TARTRATE 5 MG/5ML IV SOLN
5.0000 mg | INTRAVENOUS | Status: DC | PRN
  Administered 2021-04-05: 5 mg via INTRAVENOUS
  Filled 2021-04-05: qty 5

## 2021-04-05 NOTE — Plan of Care (Signed)
  Problem: Activity: Goal: Risk for activity intolerance will decrease Outcome: Progressing   Problem: Nutrition: Goal: Adequate nutrition will be maintained Outcome: Progressing   Problem: Coping: Goal: Level of anxiety will decrease Outcome: Progressing   Problem: Elimination: Goal: Will not experience complications related to bowel motility Outcome: Progressing Goal: Will not experience complications related to urinary retention Outcome: Progressing   

## 2021-04-05 NOTE — Progress Notes (Signed)
Progress Note    Earl Hogan   OZH:[id number]  DOB: 11/08/1961  DOA: 04/04/2021     0  PCP: Earl Palmer, MD  CC: [place] [facility] Course: Earl Hogan is a 60 yo male with PMH obesity, HLD, BPH, GAD, preDM who presented to the ER with complaints of CP.  He has noted over the proximately the past 2 weeks he has felt more anxious and under various amounts of increased stress.  He endorsed an episode of diaphoresis also prior to admission.  This has been somewhat associated with chest pain/pressure. He denied any symptoms consistent with PND or orthopnea prior to admission. On work-up in the ER he was found to be in A. fib with RVR which was a new onset for him.  He was started on a Cardizem drip and admitted for further work-up and treatment.  He became bradycardic overnight after Cardizem infusion was started with rate down into the 30s and infusion was stopped. Cardiology was also consulted given new onset of his symptoms.  Interval History:  Seen this morning with wife bedside.  Still having intermittent pain/pressure.  He was still noted to be in A. fib and rate was in the 120s to 130s.  ROS: Constitutional: negative for chills and fevers, Respiratory: negative for cough, Cardiovascular: positive for chest pressure/discomfort and Gastrointestinal: negative for abdominal pain  Assessment & Plan:  Afib with RVR - new onset/diagnosis. Etiology possibly from uncontrolled/untreated presumed OHS/OSA vs stress related given increase recently vs ischemia vs CHF - TSH normal - follow up echo  - CHA2DS2-VASc = 0 technically  - became bradycardic on cardizem drip, plus u/k EF; keep off for now  - patient still uncontrolled rate this am: start Lopressor, adjust as necessary - cardiology consult given persistent rhythm for now and may need further ischemic workup depending on echo results  Obesity Body mass index is 41.17 kg/m. - patient endorses a sleep study >10 years ago - concern  is underlying OSA/OHS given weight gain; wife also reports patient snores and has apneic periods while sleeping - outpatient repeat sleep study will be recommended at discharge   Prediabetes - A1c now is 5.6%; has gone done from previous it appears - weight loss and lifestyle modification still recommended to patient  Old records reviewed in assessment of this patient   DVT prophylaxis:  apixaban (ELIQUIS) tablet 5 mg   Code Status:   Code Status: Full Code Family Communication: wife  Disposition Plan: Status is: Observation  The patient will require care spanning > 2 midnights and should be moved to inpatient because: IV treatments appropriate due to intensity of illness or inability to take PO and Inpatient level of care appropriate due to severity of illness  Dispo: The patient is from: Home              Anticipated d/c is to: Home              Patient currently is not medically stable to d/c.   Difficult to place patient No      Risk of unplanned readmission score:     Objective: Blood pressure 120/90, pulse 97, temperature 98.1 F (36.7 C), temperature source Oral, resp. rate 20, height 5\' 10"  (1.778 m), weight 130.1 kg, SpO2 98 %.  Examination: General appearance: alert, cooperative and no distress Head: Normocephalic, without obvious abnormality, atraumatic Eyes: EOMI Lungs: clear to auscultation bilaterally Heart: irregularly irregular rhythm, S1, S2 normal and tachycardic Abdomen: normal findings: bowel  sounds normal and soft, non-tender Extremities: no edema Skin: mobility and turgor normal Neurologic: Grossly normal  Consultants:   Cardiology  Procedures:     Data Reviewed: I have personally reviewed following labs and imaging studies Results for orders placed or performed during the hospital encounter of 04/04/21 (from the past 24 hour(s))  Basic metabolic panel     Status: None   Collection Time: 04/04/21  7:17 PM  Result Value Ref Range    Sodium 137 135 - 145 mmol/L   Potassium 4.4 3.5 - 5.1 mmol/L   Chloride 107 98 - 111 mmol/L   CO2 24 22 - 32 mmol/L   Glucose, Bld 94 70 - 99 mg/dL   BUN 13 8 - 23 mg/dL   Creatinine, Ser 4.26 0.61 - 1.24 mg/dL   Calcium 9.2 8.9 - 83.4 mg/dL   GFR, Estimated >19 >62 mL/min   Anion gap 6 5 - 15  CBC     Status: None   Collection Time: 04/04/21  7:17 PM  Result Value Ref Range   WBC 9.0 4.0 - 10.5 K/uL   RBC 5.51 4.22 - 5.81 MIL/uL   Hemoglobin 15.7 13.0 - 17.0 g/dL   HCT 22.9 79.8 - 92.1 %   MCV 88.0 80.0 - 100.0 fL   MCH 28.5 26.0 - 34.0 pg   MCHC 32.4 30.0 - 36.0 g/dL   RDW 19.4 17.4 - 08.1 %   Platelets 344 150 - 400 K/uL   nRBC 0.0 0.0 - 0.2 %  Troponin I (High Sensitivity)     Status: None   Collection Time: 04/04/21  7:17 PM  Result Value Ref Range   Troponin I (High Sensitivity) 4 <18 ng/L  Magnesium     Status: None   Collection Time: 04/04/21  8:38 PM  Result Value Ref Range   Magnesium 2.3 1.7 - 2.4 mg/dL  Troponin I (High Sensitivity)     Status: None   Collection Time: 04/04/21  8:38 PM  Result Value Ref Range   Troponin I (High Sensitivity) 4 <18 ng/L  Urine rapid drug screen (hosp performed)     Status: None   Collection Time: 04/05/21 12:57 AM  Result Value Ref Range   Opiates NONE DETECTED NONE DETECTED   Cocaine NONE DETECTED NONE DETECTED   Benzodiazepines NONE DETECTED NONE DETECTED   Amphetamines NONE DETECTED NONE DETECTED   Tetrahydrocannabinol NONE DETECTED NONE DETECTED   Barbiturates NONE DETECTED NONE DETECTED  TSH     Status: None   Collection Time: 04/05/21  1:52 AM  Result Value Ref Range   TSH 2.271 0.350 - 4.500 uIU/mL  D-dimer, quantitative     Status: None   Collection Time: 04/05/21  1:52 AM  Result Value Ref Range   D-Dimer, Quant <0.27 0.00 - 0.50 ug/mL-FEU  Brain natriuretic peptide     Status: Abnormal   Collection Time: 04/05/21  1:52 AM  Result Value Ref Range   B Natriuretic Peptide 145.5 (H) 0.0 - 100.0 pg/mL  HIV  Antibody (routine testing w rflx)     Status: None   Collection Time: 04/05/21  1:52 AM  Result Value Ref Range   HIV Screen 4th Generation wRfx Non Reactive Non Reactive  Comprehensive metabolic panel     Status: Abnormal   Collection Time: 04/05/21  1:52 AM  Result Value Ref Range   Sodium 138 135 - 145 mmol/L   Potassium 4.1 3.5 - 5.1 mmol/L   Chloride 106 98 -  111 mmol/L   CO2 24 22 - 32 mmol/L   Glucose, Bld 116 (H) 70 - 99 mg/dL   BUN 13 8 - 23 mg/dL   Creatinine, Ser 6.961.08 0.61 - 1.24 mg/dL   Calcium 9.0 8.9 - 29.510.3 mg/dL   Total Protein 6.4 (L) 6.5 - 8.1 g/dL   Albumin 4.1 3.5 - 5.0 g/dL   AST 23 15 - 41 U/L   ALT 30 0 - 44 U/L   Alkaline Phosphatase 71 38 - 126 U/L   Total Bilirubin 0.9 0.3 - 1.2 mg/dL   GFR, Estimated >28>60 >41>60 mL/min   Anion gap 8 5 - 15  Magnesium     Status: None   Collection Time: 04/05/21  1:52 AM  Result Value Ref Range   Magnesium 2.2 1.7 - 2.4 mg/dL  CBC WITH DIFFERENTIAL     Status: None   Collection Time: 04/05/21  1:52 AM  Result Value Ref Range   WBC 8.0 4.0 - 10.5 K/uL   RBC 5.33 4.22 - 5.81 MIL/uL   Hemoglobin 15.3 13.0 - 17.0 g/dL   HCT 32.446.3 40.139.0 - 02.752.0 %   MCV 86.9 80.0 - 100.0 fL   MCH 28.7 26.0 - 34.0 pg   MCHC 33.0 30.0 - 36.0 g/dL   RDW 25.311.9 66.411.5 - 40.315.5 %   Platelets 301 150 - 400 K/uL   nRBC 0.0 0.0 - 0.2 %   Neutrophils Relative % 50 %   Neutro Abs 4.0 1.7 - 7.7 K/uL   Lymphocytes Relative 37 %   Lymphs Abs 3.0 0.7 - 4.0 K/uL   Monocytes Relative 9 %   Monocytes Absolute 0.7 0.1 - 1.0 K/uL   Eosinophils Relative 3 %   Eosinophils Absolute 0.2 0.0 - 0.5 K/uL   Basophils Relative 1 %   Basophils Absolute 0.1 0.0 - 0.1 K/uL   Immature Granulocytes 0 %   Abs Immature Granulocytes 0.01 0.00 - 0.07 K/uL  Protime-INR     Status: None   Collection Time: 04/05/21  1:52 AM  Result Value Ref Range   Prothrombin Time 14.5 11.4 - 15.2 seconds   INR 1.1 0.8 - 1.2  APTT     Status: None   Collection Time: 04/05/21  1:52 AM   Result Value Ref Range   aPTT 30 24 - 36 seconds  Hemoglobin A1c     Status: None   Collection Time: 04/05/21  7:28 AM  Result Value Ref Range   Hgb A1c MFr Bld 5.6 4.8 - 5.6 %   Mean Plasma Glucose 114.02 mg/dL    Recent Results (from the past 240 hour(s))  Resp Panel by RT-PCR (Flu A&B, Covid) Nasopharyngeal Swab     Status: None   Collection Time: 04/04/21 12:14 AM   Specimen: Nasopharyngeal Swab; Nasopharyngeal(NP) swabs in vial transport medium  Result Value Ref Range Status   SARS Coronavirus 2 by RT PCR NEGATIVE NEGATIVE Final    Comment: (NOTE) SARS-CoV-2 target nucleic acids are NOT DETECTED.  The SARS-CoV-2 RNA is generally detectable in upper respiratory specimens during the acute phase of infection. The lowest concentration of SARS-CoV-2 viral copies this assay can detect is 138 copies/mL. A negative result does not preclude SARS-Cov-2 infection and should not be used as the sole basis for treatment or other patient management decisions. A negative result may occur with  improper specimen collection/handling, submission of specimen other than nasopharyngeal swab, presence of viral mutation(s) within the areas targeted by this assay, and  inadequate number of viral copies(<138 copies/mL). A negative result must be combined with clinical observations, patient history, and epidemiological information. The expected result is Negative.  Fact Sheet for Patients:  BloggerCourse.com  Fact Sheet for Healthcare Providers:  SeriousBroker.it  This test is no t yet approved or cleared by the [place] FDA and  has been authorized for detection and/or diagnosis of SARS-CoV-2 by FDA under an Emergency Use Authorization (EUA). This EUA will remain  in effect (meaning this test can be used) for the duration of the COVID-19 declaration under Section 564(b)(1) of the Act, 21 U.S.C.section 360bbb-3(b)(1), unless the authorization  is terminated  or revoked sooner.       Influenza A by PCR NEGATIVE NEGATIVE Final   Influenza B by PCR NEGATIVE NEGATIVE Final    Comment: (NOTE) The Xpert Xpress SARS-CoV-2/FLU/RSV plus assay is intended as an aid in the diagnosis of influenza from Nasopharyngeal swab specimens and should not be used as a sole basis for treatment. Nasal washings and aspirates are unacceptable for Xpert Xpress SARS-CoV-2/FLU/RSV testing.  Fact Sheet for Patients: BloggerCourse.com  Fact Sheet for Healthcare Providers: SeriousBroker.it  This test is not yet approved or cleared by the [place] FDA and has been authorized for detection and/or diagnosis of SARS-CoV-2 by FDA under an Emergency Use Authorization (EUA). This EUA will remain in effect (meaning this test can be used) for the duration of the COVID-19 declaration under Section 564(b)(1) of the Act, 21 U.S.C. section 360bbb-3(b)(1), unless the authorization is terminated or revoked.  Performed at [facility], [address], [place], [place] [address]      Radiology Studies: DG Chest 2 View  Result Date: 04/04/2021 CLINICAL DATA:  Midsternal chest pain/pressure with anxiety for 2 days. EXAM: CHEST - 2 VIEW COMPARISON:  Chest CTA 04/22/2019.  No prior chest radiographs. FINDINGS: The lateral view was repeated. The heart size and mediastinal contours are normal. The lungs are clear. There is no pleural effusion or pneumothorax. No acute osseous findings are identified. Stable mildly prominent fat along the left ventricular apex. IMPRESSION: No active cardiopulmonary process. Electronically Signed   By: Carey Bullocks M.D.   On: 04/04/2021 19:30   DG Chest 2 View  Final Result      Scheduled Meds: . apixaban  5 mg Oral BID  . atorvastatin  20 mg Oral Daily  . escitalopram  10 mg Oral QHS  . loratadine  10 mg Oral QHS  . metoprolol succinate  50 mg Oral Daily  . tamsulosin   0.4 mg Oral QHS   PRN Meds: acetaminophen, melatonin, metoprolol tartrate, ondansetron (ZOFRAN) IV, polyethylene glycol Continuous Infusions:   LOS: 0 days  Time spent: Greater than 50% of the 35 minute visit was spent in counseling/coordination of care for the patient as laid out in the A&P.   Lewie Chamber, MD [facility] 04/05/2021, 12:57 PM

## 2021-04-05 NOTE — Progress Notes (Signed)
  Echocardiogram 2D Echocardiogram has been performed.  Earl Hogan 04/05/2021, 1:55 PM

## 2021-04-05 NOTE — Progress Notes (Signed)
Tele notified of HR down to 38 non-sustained. MD notified. Stopped cardizem drip. Orders for metoprolol for HR sustaining above 110.

## 2021-04-05 NOTE — Consult Note (Addendum)
Cardiology Consultation:   Patient ID: Earl Hogan MRN: [id number]; DOB: Sep 29, 1961  Admit date: 04/04/2021 Date of Consult: 04/05/2021  PCP:  Earl Palmer, MD   [facility] Providers Cardiologist:  None   New     Patient Profile:   Earl Hogan is a 60 y.o. male with a hx of thrombocytic hyperplasia, pre-DM, HLD, OA, depression, who is being seen 04/05/2021 for the evaluation of chest pain, rapid atrial fib, at the request of Dr Earl Hogan.  History of Present Illness:   Earl Hogan has felt stressed from [place] situation, racisim, Covid and other issues. He has had good stress from recently getting married.  Over the last week-10 days, he has had episodes of chest tightness, up to a 5/10. He has had them in the setting of emotional stress. He does not routinely do steps or walk very much, has a desk job. No hx exertional chest pain.  He has had 4-5 episodes total, no radiation. No associated symptoms. They would last 45" to an hour. He did not do anything to make them go away, just would rest. Did not try any medications.  Was recently on vacation in [place], did a lot of walking then, no problems.  This past Thursday, was sitting and broke out into a sweat. That in combination with the chest pain and anxiety got him to the doctor and to thence to the hospital.  Since being in the hospital, he has had chest pain, currently has it at a 2/10.   He is not aware of the atrial fibrillation, never has palpitations. He has not recently checked his BP, but has a machine. It usually is pretty good.    Past Medical History:  Diagnosis Date  . Back pain   . Depression   . History of kidney stones   . Hx of seasonal allergies   . Hypercholesteremia   . Mixed hyperlipidemia 04/04/2021  . OA (osteoarthritis)   . Pre-diabetes   . Umbilical hernia   . Wears glasses     Past Surgical History:  Procedure Laterality Date  . CARDIAC CATHETERIZATION     prior to 2010  .  COLONOSCOPY    . UMBILICAL HERNIA REPAIR N/A 05/17/2019   Procedure: LAPAROSCOPIC UMBILICAL HERNIA REPAIR WITH MESH;  Surgeon: Earl Filler, MD;  Location: [facility] OR;  Service: General;  Laterality: N/A;  . WISDOM TOOTH EXTRACTION    . WRIST SURGERY     left     Home Medications:  Prior to Admission medications   Medication Sig Start Date End Date Taking? Authorizing Provider  acetaminophen (TYLENOL) 500 MG tablet Take 500 mg by mouth every 6 (six) hours as needed (back pain.).    Yes [provider]  atorvastatin (LIPITOR) 20 MG tablet Take 20 mg by mouth daily.   Yes [provider]  cetirizine (ZYRTEC) 10 MG tablet Take 10 mg by mouth at bedtime.   Yes [provider]  Cholecalciferol (VITAMIN D3 PO) Take 1 tablet by mouth daily.   Yes [provider]  escitalopram (LEXAPRO) 10 MG tablet Take 10 mg by mouth at bedtime.    Yes [provider]  fexofenadine (ALLEGRA) 180 MG tablet Take 180 mg by mouth daily as needed for allergies.   Yes [provider]  ibuprofen (ADVIL) 200 MG tablet Take 400 mg by mouth every 8 (eight) hours as needed (back pain.).   Yes [provider]  meloxicam (MOBIC) 15 MG tablet Take 15  mg by mouth every other day. 03/02/19  Yes [provider]  tadalafil (CIALIS) 20 MG tablet Take 20 mg by mouth daily as needed for erectile dysfunction. 03/02/19  Yes [provider]  Tamsulosin HCl (FLOMAX) 0.4 MG CAPS Take 1 capsule (0.4 mg total) by mouth daily after breakfast. Patient taking differently: Take 0.4 mg by mouth at bedtime. 07/20/12  Yes Earl Hogan, Earl ReiningNicole, PA-C    Inpatient Medications: Scheduled Meds: . atorvastatin  20 mg Oral Daily  . enoxaparin (LOVENOX) injection  40 mg Subcutaneous Q24H  . escitalopram  10 mg Oral QHS  . loratadine  10 mg Oral QHS  . metoprolol tartrate  12.5 mg Oral BID  . tamsulosin  0.4 mg Oral QHS   Continuous Infusions:  PRN Meds: acetaminophen,  melatonin, metoprolol tartrate, ondansetron (ZOFRAN) IV, polyethylene glycol  Allergies:    Allergies  Allergen Reactions  . Penicillins Other (See Comments)    Unknown childhood Did it involve swelling of the face/tongue/throat, SOB, or low BP? Unknown Did it involve sudden or severe rash/hives, skin peeling, or any reaction on the inside of your mouth or nose? Unknown Did you need to seek medical attention at a hospital or doctor's office? Unknown When did it last happen? Childhood reaction. If all above answers are "NO", may proceed with cephalosporin use.     Social History:   Social History   Socioeconomic History  . Marital status: Single    Spouse name: Not on file  . Number of children: Not on file  . Years of education: Not on file  . Highest education level: Not on file  Occupational History  . Occupation: Water engineerCommunity and transportation planner  Tobacco Use  . Smoking status: Never Smoker  . Smokeless tobacco: Never Used  Vaping Use  . Vaping Use: Never used  Substance and Sexual Activity  . Alcohol use: Yes    Comment: occasional  . Drug use: No  . Sexual activity: Not on file  Other Topics Concern  . Not on file  Social History Narrative  . Not on file   Social Determinants of Health   Financial Resource Strain: Not on file  Food Insecurity: Not on file  Transportation Needs: Not on file  Physical Activity: Not on file  Stress: Not on file  Social Connections: Not on file  Intimate Partner Violence: Not on file    Family History:   Family History  Problem Relation Age of Onset  . Heart disease Mother [id number]  . Cancer Father        died in his 7650s    Family Status  Relation Name Status  . Mother  Deceased  . Father  Deceased    ROS:  Please see the history of present illness.  All other ROS reviewed and negative.     Physical Exam/Data:   Vitals:   04/05/21 0450 04/05/21 0452 04/05/21 0821 04/05/21 1204  BP: 106/69  126/87 120/90  Pulse:  (!) 53  98 97  Resp: 17  20 20   Temp: (!) 97.5 F (36.4 C)  98.1 F (36.7 C) 98.1 F (36.7 C)  TempSrc: Oral  Oral Oral  SpO2: 97%   98%  Weight:  130.1 kg    Height:        Intake/Output Summary (Last 24 hours) at 04/05/2021 1236 Last data filed at 04/05/2021 0910 Gross per 24 hour  Intake 740.71 ml  Output 300 ml  Net 440.71 ml   Last 3 Weights  04/05/2021 04/04/2021 05/17/2019  Weight (lbs) 286 lb 14.4 oz 286 lb 14.4 oz 270 lb  Weight (kg) 130.137 kg 130.137 kg 122.471 kg     Body mass index is 41.17 kg/m.  General:  Well nourished, well developed, in no acute distress HEENT: normal Lymph: no adenopathy Neck: no JVD Endocrine:  No thryomegaly Vascular: No carotid bruits; 4/4 extremity pulses 2+   Cardiac:  normal S1, S2; Irreg R&R; no murmur  Lungs:  clear to auscultation bilaterally, no wheezing, rhonchi or rales  Abd: soft, nontender, no hepatomegaly  Ext: no edema Musculoskeletal:  No deformities, BUE and BLE strength normal and equal Skin: warm and dry  Neuro:  CNs 2-12 intact, no focal abnormalities noted Psych:  Normal affect   EKG:  The EKG was personally reviewed and demonstrates: Typical atrial flutter, RVR, heart rate 116 Telemetry:  Telemetry was personally reviewed and demonstrates: Atrial flutter, heart rate dropped as low as 38 when on Cardizem, rapid at times now  Relevant CV Studies:  ECHO: Ordered  Laboratory Data:  High Sensitivity Troponin:   Recent Labs  Lab 04/04/21 1917 04/04/21 2038  TROPONINIHS 4 4     Chemistry Recent Labs  Lab 04/04/21 1917 04/05/21 0152  NA 137 138  K 4.4 4.1  CL 107 106  CO2 24 24  GLUCOSE 94 116*  BUN 13 13  CREATININE 0.97 1.08  CALCIUM 9.2 9.0  GFRNONAA >60 >60  ANIONGAP 6 8    Recent Labs  Lab 04/05/21 0152  PROT 6.4*  ALBUMIN 4.1  AST 23  ALT 30  ALKPHOS 71  BILITOT 0.9   Hematology Recent Labs  Lab 04/04/21 1917 04/05/21 0152  WBC 9.0 8.0  RBC 5.51 5.33  HGB 15.7 15.3  HCT 48.5 46.3   MCV 88.0 86.9  MCH 28.5 28.7  MCHC 32.4 33.0  RDW 11.9 11.9  PLT 344 301   BNP Recent Labs  Lab 04/05/21 0152  BNP 145.5*    DDimer  Recent Labs  Lab 04/05/21 0152  DDIMER <0.27   Lab Results  Component Value Date   HGBA1C 5.6 04/05/2021   Lab Results  Component Value Date   TSH 2.271 04/05/2021   No results found for: CHOL, HDL, LDLCALC, LDLDIRECT, TRIG, CHOLHDL   Radiology/Studies:  DG Chest 2 View  Result Date: 04/04/2021 CLINICAL DATA:  Midsternal chest pain/pressure with anxiety for 2 days. EXAM: CHEST - 2 VIEW COMPARISON:  Chest CTA 04/22/2019.  No prior chest radiographs. FINDINGS: The lateral view was repeated. The heart size and mediastinal contours are normal. The lungs are clear. There is no pleural effusion or pneumothorax. No acute osseous findings are identified. Stable mildly prominent fat along the left ventricular apex. IMPRESSION: No active cardiopulmonary process. Electronically Signed   By: Carey Bullocks M.D.   On: 04/04/2021 19:30     Assessment and Plan:   1. Persistent atrial fib, RVR - rate improving w/ metoprolol, became bradycardic on IV Cardizem - SBP 120s and HR low 100s at rest after 12.5 mg Lopressor - needs better HR control, can try changing to Toprol XL 50 mg and see if BP will tolerate, may end up on 25 mg - he is aware that anticoag is indicated, especially if he is to be ablated - he is on DVT Lovenox now, will change to Eliquis 5 mg twice daily -[name] to see and discuss ablation, but this would most likely be done as an outpatient  2.  Chest  pain: - Cardiac enzymes are negative for MI, no history of exertional symptoms - Chest pain may be related to elevated heart rate although this is not clear. - Follow-up on echo results, if EF is normal, consider Lexiscan Myoview in a.m., would be a 2-day study  3. ?Sleep apnea - never been tested, but probably has - needs testing, as outpt  Otherwise, per IM Principal  Problem:   Atrial fibrillation with rapid ventricular response (HCC) Active Problems:   Generalized anxiety disorder   Mixed hyperlipidemia   Benign prostatic hyperplasia   Risk Assessment/Risk Scores:           :[id number]}   HEAR Score (for undifferentiated chest pain):  HEAR Score: 3    CHA2DS2-VASc Score = 0  This indicates a 0.2% annual risk of stroke. The patient's score is based upon: CHF History: No HTN History: No Diabetes History: No Stroke History: No Vascular Disease History: No Age Score: 0 Gender Score: 0  For questions or updates, please contact [facility] Please consult [email or web address] for contact info under    Signed, Theodore Demark, PA-C  04/05/2021 12:36 PM

## 2021-04-05 NOTE — Progress Notes (Signed)
Pt arrived on unit. Shalhoub at bedside. Pts cardizem drip @7 .5mg /hr on arrival from ED.

## 2021-04-06 DIAGNOSIS — N400 Enlarged prostate without lower urinary tract symptoms: Secondary | ICD-10-CM | POA: Diagnosis not present

## 2021-04-06 DIAGNOSIS — I4891 Unspecified atrial fibrillation: Secondary | ICD-10-CM | POA: Diagnosis not present

## 2021-04-06 DIAGNOSIS — F411 Generalized anxiety disorder: Secondary | ICD-10-CM | POA: Diagnosis not present

## 2021-04-06 LAB — CBC WITH DIFFERENTIAL/PLATELET
Abs Immature Granulocytes: 0.02 10*3/uL (ref 0.00–0.07)
Basophils Absolute: 0.1 10*3/uL (ref 0.0–0.1)
Basophils Relative: 2 %
Eosinophils Absolute: 0.3 10*3/uL (ref 0.0–0.5)
Eosinophils Relative: 4 %
HCT: 47.1 % (ref 39.0–52.0)
Hemoglobin: 15.6 g/dL (ref 13.0–17.0)
Immature Granulocytes: 0 %
Lymphocytes Relative: 40 %
Lymphs Abs: 3.4 10*3/uL (ref 0.7–4.0)
MCH: 29.1 pg (ref 26.0–34.0)
MCHC: 33.1 g/dL (ref 30.0–36.0)
MCV: 87.7 fL (ref 80.0–100.0)
Monocytes Absolute: 0.8 10*3/uL (ref 0.1–1.0)
Monocytes Relative: 9 %
Neutro Abs: 3.8 10*3/uL (ref 1.7–7.7)
Neutrophils Relative %: 45 %
Platelets: 330 10*3/uL (ref 150–400)
RBC: 5.37 MIL/uL (ref 4.22–5.81)
RDW: 11.9 % (ref 11.5–15.5)
WBC: 8.5 10*3/uL (ref 4.0–10.5)
nRBC: 0 % (ref 0.0–0.2)

## 2021-04-06 LAB — BASIC METABOLIC PANEL
Anion gap: 6 (ref 5–15)
BUN: 14 mg/dL (ref 8–23)
CO2: 24 mmol/L (ref 22–32)
Calcium: 9.3 mg/dL (ref 8.9–10.3)
Chloride: 109 mmol/L (ref 98–111)
Creatinine, Ser: 1.39 mg/dL — ABNORMAL HIGH (ref 0.61–1.24)
GFR, Estimated: 58 mL/min — ABNORMAL LOW (ref >60)
Glucose, Bld: 99 mg/dL (ref 70–99)
Potassium: 5.2 mmol/L — ABNORMAL HIGH (ref 3.5–5.1)
Sodium: 139 mmol/L (ref 135–145)

## 2021-04-06 LAB — MAGNESIUM: Magnesium: 2.2 mg/dL (ref 1.7–2.4)

## 2021-04-06 MED ORDER — FLECAINIDE ACETATE 50 MG PO TABS
75.0000 mg | ORAL_TABLET | Freq: Two times a day (BID) | ORAL | Status: DC
  Administered 2021-04-06: 75 mg via ORAL
  Filled 2021-04-06 (×2): qty 2

## 2021-04-06 MED ORDER — APIXABAN 5 MG PO TABS: 5.0000 mg | ORAL_TABLET | Freq: Two times a day (BID) | ORAL | 3 refills | Status: DC

## 2021-04-06 MED ORDER — METOPROLOL SUCCINATE ER 50 MG PO TB24: 50.0000 mg | ORAL_TABLET | Freq: Every day | ORAL | 2 refills | Status: DC

## 2021-04-06 MED ORDER — FLECAINIDE ACETATE 150 MG PO TABS: 75.0000 mg | ORAL_TABLET | Freq: Two times a day (BID) | ORAL | 0 refills | Status: DC

## 2021-04-06 NOTE — Progress Notes (Signed)
IV removal well tolerated, belongings with patient.  Discharge instructions reviewed with patient and teach back complete.

## 2021-04-06 NOTE — Discharge Summary (Signed)
Physician Discharge Summary  JUSTINIAN MIANO Hogan:[id number] DOB: December 12, 1960 DOA: 04/04/2021  PCP: Mila Palmer, MD  Admit date: 04/04/2021 Discharge date: 04/06/2021  Admitted From: Home Disposition: Home  Recommendations for Outpatient Follow-up:  1. Follow up with PCP in 1-2 weeks 2. Please obtain BMP/CBC in one week 3.   Home Health No Equipment/Devices: None  Discharge Condition: stable CODE STATUS: Full Diet recommendation: Heart Healthy  Brief/Interim Summary: 60 y.o. male past medical history significant for obesity, BPH hyperlipidemia who presents to the ER with complaints of chest pain in the ED was found to have A. fib with RVR he also started on Cardizem drip, he then became bradycardic with a heart rate in 30s cardiology was consulted  Discharge Diagnoses:  Principal Problem:   Atrial fibrillation with rapid ventricular response (HCC) Active Problems:   Generalized anxiety disorder   Mixed hyperlipidemia   Benign prostatic hyperplasia  A. fib with RVR: Started on Cardizem drip had to be stopped because he became bradycardic. He was transitioned to flecainide and Toprol he was started on Eliquis and cardiology recommended mended a possible ablation as an outpatient and he will follow-up with them as an outpatient.  Atypical chest pain: Nonexertional cardiac biomarkers negative likely due to RVR Lexiscan as an outpatient.  Obstructive sleep apnea questionable: Undiagnosed will need a follow-up appointment for sleep study as an outpatient.  Hyperglycemia: With an A1c of 5.6, improved from previous, continue lifestyle modifications and weight loss.  Discharge Instructions  Discharge Instructions    Diet - low sodium heart healthy   Complete by: As directed    Increase activity slowly   Complete by: As directed      Allergies as of 04/06/2021      Reactions   Penicillins Other (See Comments)   Unknown childhood Did it involve swelling of the  face/tongue/throat, SOB, or low BP? Unknown Did it involve sudden or severe rash/hives, skin peeling, or any reaction on the inside of your mouth or nose? Unknown Did you need to seek medical attention at a hospital or doctor's office? Unknown When did it last happen? Childhood reaction. If all above answers are "NO", may proceed with cephalosporin use.      Medication List    TAKE these medications   acetaminophen 500 MG tablet Commonly known as: TYLENOL Take 500 mg by mouth every 6 (six) hours as needed (back pain.).   apixaban 5 MG Tabs tablet Commonly known as: ELIQUIS Take 1 tablet (5 mg total) by mouth 2 (two) times daily.   atorvastatin 20 MG tablet Commonly known as: LIPITOR Take 20 mg by mouth daily.   cetirizine 10 MG tablet Commonly known as: ZYRTEC Take 10 mg by mouth at bedtime.   escitalopram 10 MG tablet Commonly known as: LEXAPRO Take 10 mg by mouth at bedtime.   fexofenadine 180 MG tablet Commonly known as: ALLEGRA Take 180 mg by mouth daily as needed for allergies.   flecainide 150 MG tablet Commonly known as: TAMBOCOR Take 0.5 tablets (75 mg total) by mouth every 12 (twelve) hours.   ibuprofen 200 MG tablet Commonly known as: ADVIL Take 400 mg by mouth every 8 (eight) hours as needed (back pain.).   meloxicam 15 MG tablet Commonly known as: MOBIC Take 15 mg by mouth every other day.   metoprolol succinate 50 MG 24 hr tablet Commonly known as: TOPROL-XL Take 1 tablet (50 mg total) by mouth daily. Take with or immediately following a meal. Start taking on:  Apr 07, 2021   tadalafil 20 MG tablet Commonly known as: CIALIS Take 20 mg by mouth daily as needed for erectile dysfunction.   tamsulosin 0.4 MG Caps capsule Commonly known as: FLOMAX Take 1 capsule (0.4 mg total) by mouth daily after breakfast. What changed: when to take this   VITAMIN D3 PO Take 1 tablet by mouth daily.       Allergies  Allergen Reactions  . Penicillins Other  (See Comments)    Unknown childhood Did it involve swelling of the face/tongue/throat, SOB, or low BP? Unknown Did it involve sudden or severe rash/hives, skin peeling, or any reaction on the inside of your mouth or nose? Unknown Did you need to seek medical attention at a hospital or doctor's office? Unknown When did it last happen? Childhood reaction. If all above answers are "NO", may proceed with cephalosporin use.     Consultations:  Cardiology   Procedures/Studies: DG Chest 2 View  Result Date: 04/04/2021 CLINICAL DATA:  Midsternal chest pain/pressure with anxiety for 2 days. EXAM: CHEST - 2 VIEW COMPARISON:  Chest CTA 04/22/2019.  No prior chest radiographs. FINDINGS: The lateral view was repeated. The heart size and mediastinal contours are normal. The lungs are clear. There is no pleural effusion or pneumothorax. No acute osseous findings are identified. Stable mildly prominent fat along the left ventricular apex. IMPRESSION: No active cardiopulmonary process. Electronically Signed   By: Carey Bullocks M.D.   On: 04/04/2021 19:30   ECHOCARDIOGRAM COMPLETE  Result Date: 04/05/2021    ECHOCARDIOGRAM REPORT   Patient Name:   Earl Hogan Date of Exam: 04/05/2021 Medical Rec #:  [id number]       Height:       70.0 in Accession #:    [id number]      Weight:       286.9 lb Date of Birth:  05-Sep-1961       BSA:          2.433 m Patient Age:    60 years        BP:           120/90 mmHg Patient Gender: M               HR:           100 bpm. Exam Location:  Inpatient Procedure: 2D Echo Indications:    atrial fibrillation  History:        Patient has no prior history of Echocardiogram examinations.                 Risk Factors:Dyslipidemia.  Sonographer:    Delcie Roch Referring Phys: [id number] Deno Lunger SHALHOU[place] IMPRESSIONS  1. Left ventricular ejection fraction, by estimation, is 60 to 65%. The left ventricle has normal function. The left ventricle has no regional wall motion abnormalities.  Left ventricular diastolic function could not be evaluated.  2. Right ventricular systolic function is normal. The right ventricular size is normal. Tricuspid regurgitation signal is inadequate for assessing PA pressure.  3. Left atrial size was mildly dilated.  4. Right atrial size was mildly dilated.  5. The mitral valve is normal in structure. No evidence of mitral valve regurgitation. No evidence of mitral stenosis.  6. The aortic valve is normal in structure. Aortic valve regurgitation is not visualized. No aortic stenosis is present.  7. The inferior vena cava is normal in size with greater than 50% respiratory variability, suggesting right atrial pressure of 3 mmHg. FINDINGS  Left Ventricle: Left ventricular ejection fraction, by estimation, is 60 to 65%. The left ventricle has normal function. The left ventricle has no regional wall motion abnormalities. The left ventricular internal cavity size was normal in size. There is  no left ventricular hypertrophy. Left ventricular diastolic function could not be evaluated due to atrial fibrillation. Left ventricular diastolic function could not be evaluated. Right Ventricle: The right ventricular size is normal. No increase in right ventricular wall thickness. Right ventricular systolic function is normal. Tricuspid regurgitation signal is inadequate for assessing PA pressure. Left Atrium: Left atrial size was mildly dilated. Right Atrium: Right atrial size was mildly dilated. Pericardium: There is no evidence of pericardial effusion. Mitral Valve: The mitral valve is normal in structure. No evidence of mitral valve regurgitation. No evidence of mitral valve stenosis. Tricuspid Valve: The tricuspid valve is normal in structure. Tricuspid valve regurgitation is not demonstrated. No evidence of tricuspid stenosis. Aortic Valve: The aortic valve is normal in structure. Aortic valve regurgitation is not visualized. No aortic stenosis is present. Pulmonic Valve: The  pulmonic valve was normal in structure. Pulmonic valve regurgitation is not visualized. No evidence of pulmonic stenosis. Aorta: The aortic root is normal in size and structure. Venous: The inferior vena cava is normal in size with greater than 50% respiratory variability, suggesting right atrial pressure of 3 mmHg. IAS/Shunts: No atrial level shunt detected by color flow Doppler.  LEFT VENTRICLE PLAX 2D LVIDd:         5.40 cm LVIDs:         3.60 cm LV PW:         0.90 cm LV IVS:        0.90 cm LVOT diam:     2.50 cm LVOT Area:     4.91 cm  IVC IVC diam: 2.00 cm LEFT ATRIUM             Index       RIGHT ATRIUM           Index LA diam:        4.10 cm 1.68 cm/m  RA Area:     22.60 cm LA Vol (A2C):   45.1 ml 18.53 ml/m RA Volume:   71.00 ml  29.18 ml/m LA Vol (A4C):   52.2 ml 21.45 ml/m LA Biplane Vol: 52.6 ml 21.62 ml/m   AORTA Ao Root diam: 3.40 cm Ao Asc diam:  3.50 cm  SHUNTS Systemic Diam: 2.50 cm Mihai Croitoru MD Electronically signed by Thurmon Fair MD Signature Date/Time: 04/05/2021/2:21:09 PM    Final     (Echo, Carotid, EGD, Colonoscopy, ERCP)    Subjective: No complains  Discharge Exam: Vitals:   04/06/21 0435 04/06/21 0729  BP: 103/83 120/76  Pulse: 92 65  Resp: 19 18  Temp: 98 F (36.7 C) 97.7 F (36.5 C)  SpO2: 96% 97%   Vitals:   04/05/21 1900 04/06/21 0002 04/06/21 0435 04/06/21 0729  BP: 136/78 (!) 115/58 103/83 120/76  Pulse: 99 (!) 47 92 65  Resp: 18 18 19 18   Temp: 98.2 F (36.8 C) 97.7 F (36.5 C) 98 F (36.7 C) 97.7 F (36.5 C)  TempSrc: Oral Oral Oral Oral  SpO2: 98% 97% 96% 97%  Weight:    128.3 kg  Height:    5\' 10"  (1.778 m)    General: Pt is alert, awake, not in acute distress Cardiovascular: RRR, S1/S2 +, no rubs, no gallops Respiratory: CTA bilaterally, no wheezing, no rhonchi Abdominal: Soft,  NT, ND, bowel sounds + Extremities: no edema, no cyanosis    The results of significant diagnostics from this hospitalization (including imaging,  microbiology, ancillary and laboratory) are listed below for reference.     Microbiology: Recent Results (from the past 240 hour(s))  Resp Panel by RT-PCR (Flu A&B, Covid) Nasopharyngeal Swab     Status: None   Collection Time: 04/04/21 12:14 AM   Specimen: Nasopharyngeal Swab; Nasopharyngeal(NP) swabs in vial transport medium  Result Value Ref Range Status   SARS Coronavirus 2 by RT PCR NEGATIVE NEGATIVE Final    Comment: (NOTE) SARS-CoV-2 target nucleic acids are NOT DETECTED.  The SARS-CoV-2 RNA is generally detectable in upper respiratory specimens during the acute phase of infection. The lowest concentration of SARS-CoV-2 viral copies this assay can detect is 138 copies/mL. A negative result does not preclude SARS-Cov-2 infection and should not be used as the sole basis for treatment or other patient management decisions. A negative result may occur with  improper specimen collection/handling, submission of specimen other than nasopharyngeal swab, presence of viral mutation(s) within the areas targeted by this assay, and inadequate number of viral copies(<138 copies/mL). A negative result must be combined with clinical observations, patient history, and epidemiological information. The expected result is Negative.  Fact Sheet for Patients:  BloggerCourse.com  Fact Sheet for Healthcare Providers:  SeriousBroker.it  This test is no t yet approved or cleared by the [place] FDA and  has been authorized for detection and/or diagnosis of SARS-CoV-2 by FDA under an Emergency Use Authorization (EUA). This EUA will remain  in effect (meaning this test can be used) for the duration of the COVID-19 declaration under Section 564(b)(1) of the Act, 21 U.S.C.section 360bbb-3(b)(1), unless the authorization is terminated  or revoked sooner.       Influenza A by PCR NEGATIVE NEGATIVE Final   Influenza B by PCR NEGATIVE NEGATIVE  Final    Comment: (NOTE) The Xpert Xpress SARS-CoV-2/FLU/RSV plus assay is intended as an aid in the diagnosis of influenza from Nasopharyngeal swab specimens and should not be used as a sole basis for treatment. Nasal washings and aspirates are unacceptable for Xpert Xpress SARS-CoV-2/FLU/RSV testing.  Fact Sheet for Patients: BloggerCourse.com  Fact Sheet for Healthcare Providers: SeriousBroker.it  This test is not yet approved or cleared by the [place] FDA and has been authorized for detection and/or diagnosis of SARS-CoV-2 by FDA under an Emergency Use Authorization (EUA). This EUA will remain in effect (meaning this test can be used) for the duration of the COVID-19 declaration under Section 564(b)(1) of the Act, 21 U.S.C. section 360bbb-3(b)(1), unless the authorization is terminated or revoked.  Performed at [facility], [address], [place], [place] [address]      Labs: BNP (last 3 results) Recent Labs    04/05/21 0152  BNP 145.5*   Basic Metabolic Panel: Recent Labs  Lab 04/04/21 1917 04/04/21 2038 04/05/21 0152 04/06/21 0139  NA 137  --  138 139  K 4.4  --  4.1 5.2*  CL 107  --  106 109  CO2 24  --  24 24  GLUCOSE 94  --  116* 99  BUN 13  --  13 14  CREATININE 0.97  --  1.08 1.39*  CALCIUM 9.2  --  9.0 9.3  MG  --  2.3 2.2 2.2   Liver Function Tests: Recent Labs  Lab 04/05/21 0152  AST 23  ALT 30  ALKPHOS 71  BILITOT  0.9  PROT 6.4*  ALBUMIN 4.1   No results for input(s): LIPASE, AMYLASE in the last 168 hours. No results for input(s): AMMONIA in the last 168 hours. CBC: Recent Labs  Lab 04/04/21 1917 04/05/21 0152 04/06/21 0139  WBC 9.0 8.0 8.5  NEUTROABS  --  4.0 3.8  HGB 15.7 15.3 15.6  HCT 48.5 46.3 47.1  MCV 88.0 86.9 87.7  PLT 344 301 330   Cardiac Enzymes: No results for input(s): CKTOTAL, CKMB, CKMBINDEX, TROPONINI in the last 168 hours. BNP: Invalid  input(s): POCBNP CBG: No results for input(s): GLUCAP in the last 168 hours. D-Dimer Recent Labs    04/05/21 0152  DDIMER <0.27   Hgb A1c Recent Labs    04/05/21 0728  HGBA1C 5.6   Lipid Profile No results for input(s): CHOL, HDL, LDLCALC, TRIG, CHOLHDL, LDLDIRECT in the last 72 hours. Thyroid function studies Recent Labs    04/05/21 0152  TSH 2.271   Anemia work up No results for input(s): VITAMINB12, FOLATE, FERRITIN, TIBC, IRON, RETICCTPCT in the last 72 hours. Urinalysis    Component Value Date/Time   COLORURINE YELLOW 07/20/2012 0536   APPEARANCEUR CLEAR 07/20/2012 0536   LABSPEC 1.021 07/20/2012 0536   PHURINE 6.0 07/20/2012 0536   GLUCOSEU NEGATIVE 07/20/2012 0536   HGBUR NEGATIVE 07/20/2012 0536   BILIRUBINUR NEGATIVE 07/20/2012 0536   KETONESUR NEGATIVE 07/20/2012 0536   PROTEINUR NEGATIVE 07/20/2012 0536   UROBILINOGEN 1.0 07/20/2012 0536   NITRITE NEGATIVE 07/20/2012 0536   LEUKOCYTESUR NEGATIVE 07/20/2012 0536   Sepsis Labs Invalid input(s): PROCALCITONIN,  WBC,  LACTICIDVEN Microbiology Recent Results (from the past 240 hour(s))  Resp Panel by RT-PCR (Flu A&B, Covid) Nasopharyngeal Swab     Status: None   Collection Time: 04/04/21 12:14 AM   Specimen: Nasopharyngeal Swab; Nasopharyngeal(NP) swabs in vial transport medium  Result Value Ref Range Status   SARS Coronavirus 2 by RT PCR NEGATIVE NEGATIVE Final    Comment: (NOTE) SARS-CoV-2 target nucleic acids are NOT DETECTED.  The SARS-CoV-2 RNA is generally detectable in upper respiratory specimens during the acute phase of infection. The lowest concentration of SARS-CoV-2 viral copies this assay can detect is 138 copies/mL. A negative result does not preclude SARS-Cov-2 infection and should not be used as the sole basis for treatment or other patient management decisions. A negative result may occur with  improper specimen collection/handling, submission of specimen other than nasopharyngeal  swab, presence of viral mutation(s) within the areas targeted by this assay, and inadequate number of viral copies(<138 copies/mL). A negative result must be combined with clinical observations, patient history, and epidemiological information. The expected result is Negative.  Fact Sheet for Patients:  BloggerCourse.com  Fact Sheet for Healthcare Providers:  SeriousBroker.it  This test is no t yet approved or cleared by the [place] FDA and  has been authorized for detection and/or diagnosis of SARS-CoV-2 by FDA under an Emergency Use Authorization (EUA). This EUA will remain  in effect (meaning this test can be used) for the duration of the COVID-19 declaration under Section 564(b)(1) of the Act, 21 U.S.C.section 360bbb-3(b)(1), unless the authorization is terminated  or revoked sooner.       Influenza A by PCR NEGATIVE NEGATIVE Final   Influenza B by PCR NEGATIVE NEGATIVE Final    Comment: (NOTE) The Xpert Xpress SARS-CoV-2/FLU/RSV plus assay is intended as an aid in the diagnosis of influenza from Nasopharyngeal swab specimens and should not be used as a sole basis for treatment. Nasal  washings and aspirates are unacceptable for Xpert Xpress SARS-CoV-2/FLU/RSV testing.  Fact Sheet for Patients: BloggerCourse.comhttps://www.fda.gov/media/152166/download  Fact Sheet for Healthcare Providers: SeriousBroker.ithttps://www.fda.gov/media/152162/download  This test is not yet approved or cleared by the [place] FDA and has been authorized for detection and/or diagnosis of SARS-CoV-2 by FDA under an Emergency Use Authorization (EUA). This EUA will remain in effect (meaning this test can be used) for the duration of the COVID-19 declaration under Section 564(b)(1) of the Act, 21 U.S.C. section 360bbb-3(b)(1), unless the authorization is terminated or revoked.  Performed at [facility], [address] [place], [place] [id number]      Time  coordinating discharge: Over 30 minutes  SIGNED:   Marinda ElkAbraham Feliz Ortiz, MD  [facility] 04/06/2021, 10:53 AM Pager   If 7PM-7AM, please contact night-coverage [email or web address] Password [email or web address]

## 2021-04-06 NOTE — Progress Notes (Signed)
Progress Note  Patient Name: Earl Hogan Date of Encounter: 04/06/2021  [facility] Cardiologist: Lanier Prude   Subjective   NAEO. Back in sinus rhythm.  Inpatient Medications    Scheduled Meds: . apixaban  5 mg Oral BID  . atorvastatin  20 mg Oral Daily  . escitalopram  10 mg Oral QHS  . loratadine  10 mg Oral QHS  . metoprolol succinate  50 mg Oral Daily  . tamsulosin  0.4 mg Oral QHS   Continuous Infusions:  PRN Meds: acetaminophen, melatonin, metoprolol tartrate, ondansetron (ZOFRAN) IV, polyethylene glycol   Vital Signs    Vitals:   04/05/21 1900 04/06/21 0002 04/06/21 0435 04/06/21 0729  BP: 136/78 (!) 115/58 103/83 120/76  Pulse: 99 (!) 47 92 65  Resp: 18 18 19 18   Temp: 98.2 F (36.8 C) 97.7 F (36.5 C) 98 F (36.7 C) 97.7 F (36.5 C)  TempSrc: Oral Oral Oral Oral  SpO2: 98% 97% 96% 97%  Weight:    128.3 kg  Height:    5\' 10"  (1.778 m)    Intake/Output Summary (Last 24 hours) at 04/06/2021 1038 Last data filed at 04/06/2021 0823 Gross per 24 hour  Intake 960 ml  Output 725 ml  Net 235 ml   Last 3 Weights 04/06/2021 04/05/2021 04/04/2021  Weight (lbs) 282 lb 13.6 oz 286 lb 14.4 oz 286 lb 14.4 oz  Weight (kg) 128.3 kg 130.137 kg 130.137 kg      Telemetry    Sinus rhythm - Personally Reviewed  ECG    sinus - Personally Reviewed  Physical Exam   GEN: No acute distress.   Neck: No JVD Cardiac: RRR, no murmurs, rubs, or gallops.  Respiratory: Clear to auscultation bilaterally. GI: Soft, nontender, non-distended  MS: No edema; No deformity. Neuro:  Nonfocal  Psych: Normal affect   Labs    High Sensitivity Troponin:   Recent Labs  Lab 04/04/21 1917 04/04/21 2038  TROPONINIHS 4 4      Chemistry Recent Labs  Lab 04/04/21 1917 04/05/21 0152 04/06/21 0139  NA 137 138 139  K 4.4 4.1 5.2*  CL 107 106 109  CO2 24 24 24   GLUCOSE 94 116* 99  BUN 13 13 14   CREATININE 0.97 1.08 1.39*  CALCIUM 9.2 9.0 9.3  PROT  --  6.4*   --   ALBUMIN  --  4.1  --   AST  --  23  --   ALT  --  30  --   ALKPHOS  --  71  --   BILITOT  --  0.9  --   GFRNONAA >60 >60 58*  ANIONGAP 6 8 6      Hematology Recent Labs  Lab 04/04/21 1917 04/05/21 0152 04/06/21 0139  WBC 9.0 8.0 8.5  RBC 5.51 5.33 5.37  HGB 15.7 15.3 15.6  HCT 48.5 46.3 47.1  MCV 88.0 86.9 87.7  MCH 28.5 28.7 29.1  MCHC 32.4 33.0 33.1  RDW 11.9 11.9 11.9  PLT 344 301 330    BNP Recent Labs  Lab 04/05/21 0152  BNP 145.5*     DDimer  Recent Labs  Lab 04/05/21 0152  DDIMER <0.27     Radiology    DG Chest 2 View  Result Date: 04/04/2021 CLINICAL DATA:  Midsternal chest pain/pressure with anxiety for 2 days. EXAM: CHEST - 2 VIEW COMPARISON:  Chest CTA 04/22/2019.  No prior chest radiographs. FINDINGS: The lateral view was repeated. The heart  size and mediastinal contours are normal. The lungs are clear. There is no pleural effusion or pneumothorax. No acute osseous findings are identified. Stable mildly prominent fat along the left ventricular apex. IMPRESSION: No active cardiopulmonary process. Electronically Signed   By: Carey Bullocks M.D.   On: 04/04/2021 19:30   ECHOCARDIOGRAM COMPLETE  Result Date: 04/05/2021    ECHOCARDIOGRAM REPORT   Patient Name:   Earl Hogan Date of Exam: 04/05/2021 Medical Rec #:  [id number]       Height:       70.0 in Accession #:    [id number]      Weight:       286.9 lb Date of Birth:  12-14-1960       BSA:          2.433 m Patient Age:    60 years        BP:           120/90 mmHg Patient Gender: M               HR:           100 bpm. Exam Location:  Inpatient Procedure: 2D Echo Indications:    atrial fibrillation  History:        Patient has no prior history of Echocardiogram examinations.                 Risk Factors:Dyslipidemia.  Sonographer:    Delcie Roch Referring Phys: [id number] Deno Lunger SHAL[place] IMPRESSIONS  1. Left ventricular ejection fraction, by estimation, is 60 to 65%. The left ventricle has normal  function. The left ventricle has no regional wall motion abnormalities. Left ventricular diastolic function could not be evaluated.  2. Right ventricular systolic function is normal. The right ventricular size is normal. Tricuspid regurgitation signal is inadequate for assessing PA pressure.  3. Left atrial size was mildly dilated.  4. Right atrial size was mildly dilated.  5. The mitral valve is normal in structure. No evidence of mitral valve regurgitation. No evidence of mitral stenosis.  6. The aortic valve is normal in structure. Aortic valve regurgitation is not visualized. No aortic stenosis is present.  7. The inferior vena cava is normal in size with greater than 50% respiratory variability, suggesting right atrial pressure of 3 mmHg. FINDINGS  Left Ventricle: Left ventricular ejection fraction, by estimation, is 60 to 65%. The left ventricle has normal function. The left ventricle has no regional wall motion abnormalities. The left ventricular internal cavity size was normal in size. There is  no left ventricular hypertrophy. Left ventricular diastolic function could not be evaluated due to atrial fibrillation. Left ventricular diastolic function could not be evaluated. Right Ventricle: The right ventricular size is normal. No increase in right ventricular wall thickness. Right ventricular systolic function is normal. Tricuspid regurgitation signal is inadequate for assessing PA pressure. Left Atrium: Left atrial size was mildly dilated. Right Atrium: Right atrial size was mildly dilated. Pericardium: There is no evidence of pericardial effusion. Mitral Valve: The mitral valve is normal in structure. No evidence of mitral valve regurgitation. No evidence of mitral valve stenosis. Tricuspid Valve: The tricuspid valve is normal in structure. Tricuspid valve regurgitation is not demonstrated. No evidence of tricuspid stenosis. Aortic Valve: The aortic valve is normal in structure. Aortic valve regurgitation  is not visualized. No aortic stenosis is present. Pulmonic Valve: The pulmonic valve was normal in structure. Pulmonic valve regurgitation is not visualized. No evidence of pulmonic stenosis. Aorta:  The aortic root is normal in size and structure. Venous: The inferior vena cava is normal in size with greater than 50% respiratory variability, suggesting right atrial pressure of 3 mmHg. IAS/Shunts: No atrial level shunt detected by color flow Doppler.  LEFT VENTRICLE PLAX 2D LVIDd:         5.40 cm LVIDs:         3.60 cm LV PW:         0.90 cm LV IVS:        0.90 cm LVOT diam:     2.50 cm LVOT Area:     4.91 cm  IVC IVC diam: 2.00 cm LEFT ATRIUM             Index       RIGHT ATRIUM           Index LA diam:        4.10 cm 1.68 cm/m  RA Area:     22.60 cm LA Vol (A2C):   45.1 ml 18.53 ml/m RA Volume:   71.00 ml  29.18 ml/m LA Vol (A4C):   52.2 ml 21.45 ml/m LA Biplane Vol: 52.6 ml 21.62 ml/m   AORTA Ao Root diam: 3.40 cm Ao Asc diam:  3.50 cm  SHUNTS Systemic Diam: 2.50 cm Rachelle Hora Croitoru MD Electronically signed by Thurmon Fair MD Signature Date/Time: 04/05/2021/2:21:09 PM    Final     Cardiac Studies   04/05/2021 Echo personally reviewed EF normal, 60% RV normal LA mildly dilated No significant valvular abnormalities  Patient Profile     60 y.o. male with paroxysmal atrial fibrillation and flutter.  Assessment & Plan     #symptomatic paroxysmal atrial fibrillation and flutter - plan to start apixaban 5mg  PO BID. I suspect the chadsvasc underestimates his risk of ischemic stroke due to his probable OSA and weight and pre-DM. I discussed this medication with the patient this morning and he is agreeable to proceed. - to help maintain sinus rhythm, will start flecainide 75mg  PO BID. We will see him back in about 7-10 days for an ECG to confirm he tolerates this medication. - exercise SPECT as outpatient - patient to continue working on weight loss - outpatient sleep study--my office will help  arrange - continue metoprolol  #probable sleep apnea - outpatient sleep study  #obesity counseled   For questions or updates, please contact [facility] Please consult [email or web address] for contact info under        Signed, , MD  04/06/2021, 10:38 AM

## 2021-04-06 NOTE — Care Management (Signed)
Met with patient at bedside. Provided with Eliquis a fib booklet and 30 day free card and copay reduction card.

## 2021-04-07 ENCOUNTER — Telehealth: Payer: Self-pay

## 2021-04-07 DIAGNOSIS — R072 Precordial pain: Secondary | ICD-10-CM

## 2021-04-07 NOTE — Telephone Encounter (Signed)
-----   Message from Lanier Prude, MD sent at 04/06/2021 10:45 AM EDT ----- Elvina Sidle, Seeing this patient this weekend. Starting flecainide for paroxysmal atrial fib/flutter today.  Can you get him in to see me in 7-10 days for ECG and to schedule an exercise nuc stress?  OK to overbook.  Thanks! Sheria Lang

## 2021-04-07 NOTE — Telephone Encounter (Signed)
Stress test ordered.  Follow up scheduled.  MyChart message sent.

## 2021-04-15 ENCOUNTER — Encounter: Payer: Self-pay | Admitting: Cardiology

## 2021-04-15 ENCOUNTER — Ambulatory Visit: Payer: PRIVATE HEALTH INSURANCE | Admitting: Cardiology

## 2021-04-15 ENCOUNTER — Other Ambulatory Visit: Payer: Self-pay

## 2021-04-15 VITALS — BP 122/94 | HR 64 | Ht 70.0 in | Wt 296.0 lb

## 2021-04-15 DIAGNOSIS — I4892 Unspecified atrial flutter: Secondary | ICD-10-CM | POA: Diagnosis not present

## 2021-04-15 DIAGNOSIS — I480 Paroxysmal atrial fibrillation: Secondary | ICD-10-CM | POA: Diagnosis not present

## 2021-04-15 NOTE — Patient Instructions (Addendum)
Medication Instructions:  Your physician recommends that you continue on your current medications as directed. Please refer to the Current Medication list given to you today.  Labwork: None ordered.  Testing/Procedures: None ordered.  Follow-Up: Your physician wants you to follow-up in: 07/14/21 at 8:15 am with Steffanie Dunn, MD   Any Other Special Instructions Will Be Listed Below (If Applicable).  If you need a refill on your cardiac medications before your next appointment, please call your pharmacy.

## 2021-04-15 NOTE — Progress Notes (Signed)
Electrophysiology Office Follow up Visit Note:    Date:  04/15/2021   ID:  Earl Hogan, DOB [birth year], MRN [id number]  PCP:  Mila Palmer, MD  [facility] Cardiologist:  None  [facility] Electrophysiologist:  Lanier Prude, MD    Interval History:    Earl Hogan is a 60 y.o. male who presents for a follow up visit.  I last saw the patient Apr 06, 2021 while he was hospitalized for atrial fibrillation with RVR.  He was highly symptomatic when he was in atrial fibrillation.  We started him on Eliquis 5 mg twice daily and started flecainide 75 mg twice daily for rhythm control.  He tells me he did not have his medication for the first week after hospital discharge.  He then got his medicine but was taking twice the recommended dose of flecainide.  He is now dropped down to the recommended dose of 75 mg twice daily.  He tells me that he is tolerating the medication overall okay but sometimes does note some dizziness when he stands up from a seated position.  He tells me the last few days he has felt overall better.  He is tolerating the Eliquis without bleeding issues.  Today he tells me that he thinks in hindsight he has been having atrial fibrillation for much longer than he previously thought.   Past Medical History:  Diagnosis Date  . Back pain   . Depression   . History of kidney stones   . Hx of seasonal allergies   . Hypercholesteremia   . Mixed hyperlipidemia 04/04/2021  . OA (osteoarthritis)   . Pre-diabetes   . Umbilical hernia   . Wears glasses     Past Surgical History:  Procedure Laterality Date  . CARDIAC CATHETERIZATION     prior to 2010  . COLONOSCOPY    . UMBILICAL HERNIA REPAIR N/A 05/17/2019   Procedure: LAPAROSCOPIC UMBILICAL HERNIA REPAIR WITH MESH;  Surgeon: Axel Filler, MD;  Location: [facility] OR;  Service: General;  Laterality: N/A;  . WISDOM TOOTH EXTRACTION    . WRIST SURGERY     left    Current Medications: No outpatient  medications have been marked as taking for the 04/15/21 encounter (Office Visit) with Lanier Prude, MD.     Allergies:   Penicillins   Social History   Socioeconomic History  . Marital status: Married    Spouse name: Not on file  . Number of children: Not on file  . Years of education: Not on file  . Highest education level: Not on file  Occupational History  . Occupation: Water engineer  Tobacco Use  . Smoking status: Never Smoker  . Smokeless tobacco: Never Used  Vaping Use  . Vaping Use: Never used  Substance and Sexual Activity  . Alcohol use: Yes    Comment: occasional  . Drug use: No  . Sexual activity: Not on file  Other Topics Concern  . Not on file  Social History Narrative  . Not on file   Social Determinants of Health   Financial Resource Strain: Not on file  Food Insecurity: Not on file  Transportation Needs: Not on file  Physical Activity: Not on file  Stress: Not on file  Social Connections: Not on file     Family History: The patient's family history includes Cancer in his father; Heart disease (age of onset: 33) in his mother.  ROS:   Please see the history of present  illness.    All other systems reviewed and are negative.  EKGs/Labs/Other Studies Reviewed:    The following studies were reviewed today:  Outside blood work from Apr 11, 2021 shows a hemoglobin of 13, creatinine of 1  EKG:  The ekg ordered today demonstrates sinus rhythm.  PR interval 194 ms.  QRS duration 100 ms.  Recent Labs: 04/05/2021: ALT 30; B Natriuretic Peptide 145.5; TSH 2.271 04/06/2021: BUN 14; Creatinine, Ser 1.39; Hemoglobin 15.6; Magnesium 2.2; Platelets 330; Potassium 5.2; Sodium 139  Recent Lipid Panel No results found for: CHOL, TRIG, HDL, CHOLHDL, VLDL, LDLCALC, LDLDIRECT  Physical Exam:    VS:  BP (!) 122/94   Pulse 64   Ht 5\' 10"  (1.778 m)   Wt 296 lb (134.3 kg)   SpO2 96%   BMI 42.47 kg/m     Wt Readings from Last 3  Encounters:  04/15/21 296 lb (134.3 kg)  04/06/21 282 lb 13.6 oz (128.3 kg)  05/17/19 270 lb (122.5 kg)     GEN: Well nourished, well developed in no acute distress.  Obese HEENT: Normal NECK: No JVD; No carotid bruits LYMPHATICS: No lymphadenopathy CARDIAC: RRR, no murmurs, rubs, gallops RESPIRATORY:  Clear to auscultation without rales, wheezing or rhonchi  ABDOMEN: Soft, non-tender, non-distended MUSCULOSKELETAL: Trivial to 1+ pitting bilateral lower extremity edema to the shins; No deformity  SKIN: Warm and dry NEUROLOGIC:  Alert and oriented x 3 PSYCHIATRIC:  Normal affect   ASSESSMENT:    1. Paroxysmal atrial fibrillation (HCC)   2. Atrial flutter, unspecified type (HCC)    PLAN:    In order of problems listed above:  1. Paroxysmal atrial fibrillation and flutter Symptomatic.  On Eliquis for stroke prophylaxis. On flecainide 75 mg by mouth twice daily for rhythm control.  He will continue taking his metoprolol. Need to evaluate for coronary artery disease.  We will do so with a exercise nuclear stress test.  This is scheduled for the next couple of weeks. Will order a home sleep study today.  2.  Obesity He is working on losing weight.  3.  Sleep apnea, probable Outpatient sleep study ordered  Plan to follow-up in 3 months.   Medication Adjustments/Labs and Tests Ordered: Current medicines are reviewed at length with the patient today.  Concerns regarding medicines are outlined above.  Orders Placed This Encounter  Procedures  . EKG 12-Lead  . Itamar Sleep Study   No orders of the defined types were placed in this encounter.    Signed, 05/19/19, MD, [facility], [facility] 04/15/2021 6:22 PM    Electrophysiology [facility]

## 2021-04-21 ENCOUNTER — Telehealth: Payer: Self-pay | Admitting: *Deleted

## 2021-04-21 NOTE — Telephone Encounter (Signed)
-----   Message from Patricia Pesa, RN sent at 04/21/2021  1:54 PM EDT ----- Regarding: Itamar sleep study Per Fleet Contras at [facility], no precert is required for CPT 95800. Ref#[id number]. ----- Message ----- From: Sampson Goon, RN Sent: 04/15/2021   3:53 PM EDT To: [facility]  Patient has been ordered a sleep study by [name].  Thank you  Inetta Fermo

## 2021-04-21 NOTE — Telephone Encounter (Signed)
Left message for pt to call me back in regards to sleep study. Need to provide pt the PIN # [id number] for Itamar Sleep Study.

## 2021-04-22 ENCOUNTER — Encounter (INDEPENDENT_AMBULATORY_CARE_PROVIDER_SITE_OTHER): Payer: PRIVATE HEALTH INSURANCE | Admitting: Cardiology

## 2021-04-22 DIAGNOSIS — G4734 Idiopathic sleep related nonobstructive alveolar hypoventilation: Secondary | ICD-10-CM | POA: Diagnosis not present

## 2021-04-22 DIAGNOSIS — G4733 Obstructive sleep apnea (adult) (pediatric): Secondary | ICD-10-CM | POA: Diagnosis not present

## 2021-04-22 NOTE — Telephone Encounter (Signed)
Left message for pt call back for PIN # for sleep study.

## 2021-04-22 NOTE — Telephone Encounter (Signed)
S/w pt and he has been mad aware ok to proceed with Itmar Sleep Study and has been given PIN # [id number]. Pt will proceed with sleep study tonight or tomorrow night at the latest. Pt thanked me for the call and the help.

## 2021-04-22 NOTE — Telephone Encounter (Signed)
Patient was returning call. Please advise ?

## 2021-04-23 ENCOUNTER — Telehealth (HOSPITAL_COMMUNITY): Payer: Self-pay | Admitting: *Deleted

## 2021-04-23 NOTE — Telephone Encounter (Signed)
Left message on voicemail per DPR in reference to upcoming appointment scheduled on 04/30/21 with detailed instructions given per Myocardial Perfusion Study Information Sheet for the test. LM to arrive 15 minutes early, and that it is imperative to arrive on time for appointment to keep from having the test rescheduled. If you need to cancel or reschedule your appointment, please call the office within 24 hours of your appointment. Failure to do so may result in a cancellation of your appointment, and a $50 no show fee. Phone number given for call back for any questions. Gursimran Litaker Jacqueline   

## 2021-04-30 ENCOUNTER — Ambulatory Visit (HOSPITAL_COMMUNITY): Payer: PRIVATE HEALTH INSURANCE | Attending: Cardiology

## 2021-04-30 ENCOUNTER — Other Ambulatory Visit: Payer: Self-pay

## 2021-04-30 DIAGNOSIS — R072 Precordial pain: Secondary | ICD-10-CM | POA: Diagnosis not present

## 2021-04-30 MED ORDER — REGADENOSON 0.4 MG/5ML IV SOLN
0.4000 mg | Freq: Once | INTRAVENOUS | Status: AC
  Administered 2021-04-30: 0.4 mg via INTRAVENOUS

## 2021-04-30 MED ORDER — TECHNETIUM TC 99M TETROFOSMIN IV KIT
30.2000 | PACK | Freq: Once | INTRAVENOUS | Status: AC | PRN
  Administered 2021-04-30: 30.2 via INTRAVENOUS
  Filled 2021-04-30: qty 31

## 2021-05-01 ENCOUNTER — Ambulatory Visit (HOSPITAL_COMMUNITY): Payer: PRIVATE HEALTH INSURANCE | Attending: Cardiology

## 2021-05-01 ENCOUNTER — Ambulatory Visit: Payer: PRIVATE HEALTH INSURANCE

## 2021-05-01 DIAGNOSIS — I480 Paroxysmal atrial fibrillation: Secondary | ICD-10-CM

## 2021-05-01 DIAGNOSIS — I4892 Unspecified atrial flutter: Secondary | ICD-10-CM

## 2021-05-01 LAB — MYOCARDIAL PERFUSION IMAGING
LV dias vol: 122 mL (ref 62–150)
LV sys vol: 63 mL
Peak HR: 66 {beats}/min
Rest HR: 56 {beats}/min
SDS: 0
SRS: 0
SSS: 0
TID: 1.17

## 2021-05-01 MED ORDER — TECHNETIUM TC 99M TETROFOSMIN IV KIT
31.3000 | PACK | Freq: Once | INTRAVENOUS | Status: AC | PRN
  Administered 2021-05-01: 31.3 via INTRAVENOUS
  Filled 2021-05-01: qty 32

## 2021-05-01 NOTE — Progress Notes (Signed)
   Sleep Study Report  Patient Information Name: Earl Hogan  ID: [id number] Birth Date: Jan 11, 1961  Age: 60  Gender: Male Insurer: BMI: 42.6 (W=297 lb, H=5' 10'') Study Date: 04/22/2021 Referring Physician:Cameron  Lambert, MD  TEST DESCRIPTION: Home sleep apnea testing was completed using the WatchPat, a Type 1 device, utilizing peripheral arterial tonometry (PAT), chest movement, actigraphy, pulse oximetry, pulse rate, body position and snore. AHI was calculated with apnea and hypopnea using valid sleep time as the denominator. RDI includes apneas, hypopneas, and RERAs. The data acquired and the scoring of sleep and all associated events were performed in accordance with the recommended standards and specifications as outlined in the AASM Manual for the Scoring of Sleep and Associated Events 2.2.0 (2015).  FINDINGS: 1. Severe Obstructive Sleep Apnea with AHI 57/hr.  2. No Central Sleep Apnea with pAHIc 0.9/hr. 3. Oxygen desaturations as low as 73%. 4. Severe snoring was present. O2 sats were < 88% for 19 min. 5. Total sleep time was 7 hrs and 2 min. 6. 16.2*% of total sleep time was spent in REM sleep.  7. Normal sleep onset latency at 20 min.  8. Prolonged REM sleep onset latency at 166 min.  9. Total awakenings were 7.   DIAGNOSIS:  Severe Obstructive Sleep Apnea (G47.33) Nocturnal Hypoxemia  RECOMMENDATIONS: 1. Clinical correlation of these findings is necessary. The decision to treat obstructive sleep apnea (OSA) is usually based on the presence of apnea symptoms or the presence of associated medical conditions such as Hypertension, Congestive Heart Failure, Atrial Fibrillation or Obesity. The most common symptoms of OSA are snoring, gasping for breath while sleeping, daytime sleepiness and fatigue.   2. Initiating apnea therapy is recommended given the presence of symptoms and/or associated conditions.  Recommend proceeding with one of the following:   a. Auto-CPAP  therapy with a pressure range of 5-20cm H2O.   b. An oral appliance (OA) that can be obtained from certain dentists with expertise in sleep medicine. These are primarily of use in non-obese patients with mild and moderate disease.   c. An ENT consultation which may be useful to look for specific causes of obstruction and possible treatment options.   d. If patient is intolerant to PAP therapy, consider referral to ENT for evaluation for hypoglossal nerve stimulator.   3. Close follow-up is necessary to ensure success with CPAP or oral appliance therapy for maximum benefit   4. A follow-up oximetry study on CPAP is recommended to assess the adequacy of therapy and determine the need for supplemental oxygen or the potential need for Bi-level therapy. An arterial blood gas to determine the adequacy of baseline ventilation and oxygenation should also be considered.  5. Healthy sleep recommendations include: adequate nightly sleep (normal 7-9 hrs/night), avoidance of caffeine after noon and alcohol near bedtime, and maintaining a sleep environment that is cool, dark and quiet.  6. Weight loss for overweight patients is recommended. Even modest amounts of weight loss can significantly improve the severity of sleep apnea.  7. Snoring recommendationsinclude: weight loss where appropriate, side sleeping, and avoidance of alcohol before bed.  8. Operation of motor vehicle or dangerous equipment must be avoided when feeling drowsy, excessively sleepy, or mentally fatigued.  Report prepared by: Signature: Shonia Skilling, md; FACC; Diplomat, American Board of Sleep Medicine  Electronically Signed: Jun 2, 202  

## 2021-05-01 NOTE — Procedures (Signed)
  Sleep Study Report  Patient Information Name: Earl Hogan  ID: [id number] Birth Date: Apr 06, 1961  Age: 60  Gender: Male Insurer: BMI: 42.6 (W=297 lb, H=5' 10'') Study Date: 04/22/2021 Referring Physician:Cameron  Lalla Brothers, MD  TEST DESCRIPTION: Home sleep apnea testing was completed using the WatchPat, a Type 1 device, utilizing peripheral arterial tonometry (PAT), chest movement, actigraphy, pulse oximetry, pulse rate, body position and snore. AHI was calculated with apnea and hypopnea using valid sleep time as the denominator. RDI includes apneas, hypopneas, and RERAs. The data acquired and the scoring of sleep and all associated events were performed in accordance with the recommended standards and specifications as outlined in the AASM Manual for the Scoring of Sleep and Associated Events 2.2.0 (2015).  FINDINGS: 1. Severe Obstructive Sleep Apnea with AHI 57/hr.  2. No Central Sleep Apnea with pAHIc 0.9/hr. 3. Oxygen desaturations as low as 73%. 4. Severe snoring was present. O2 sats were < 88% for 19 min. 5. Total sleep time was 7 hrs and 2 min. 6. 16.2*% of total sleep time was spent in REM sleep.  7. Normal sleep onset latency at 20 min.  8. Prolonged REM sleep onset latency at 166 min.  9. Total awakenings were 7.   DIAGNOSIS:  Severe Obstructive Sleep Apnea (G47.33) Nocturnal Hypoxemia  RECOMMENDATIONS: 1. Clinical correlation of these findings is necessary. The decision to treat obstructive sleep apnea (OSA) is usually based on the presence of apnea symptoms or the presence of associated medical conditions such as Hypertension, Congestive Heart Failure, Atrial Fibrillation or Obesity. The most common symptoms of OSA are snoring, gasping for breath while sleeping, daytime sleepiness and fatigue.   2. Initiating apnea therapy is recommended given the presence of symptoms and/or associated conditions.  Recommend proceeding with one of the following:   a. Auto-CPAP  therapy with a pressure range of 5-20cm H2O.   b. An oral appliance (OA) that can be obtained from certain dentists with expertise in sleep medicine. These are primarily of use in non-obese patients with mild and moderate disease.   c. An ENT consultation which may be useful to look for specific causes of obstruction and possible treatment options.   d. If patient is intolerant to PAP therapy, consider referral to ENT for evaluation for hypoglossal nerve stimulator.   3. Close follow-up is necessary to ensure success with CPAP or oral appliance therapy for maximum benefit   4. A follow-up oximetry study on CPAP is recommended to assess the adequacy of therapy and determine the need for supplemental oxygen or the potential need for Bi-level therapy. An arterial blood gas to determine the adequacy of baseline ventilation and oxygenation should also be considered.  5. Healthy sleep recommendations include: adequate nightly sleep (normal 7-9 hrs/night), avoidance of caffeine after noon and alcohol near bedtime, and maintaining a sleep environment that is cool, dark and quiet.  6. Weight loss for overweight patients is recommended. Even modest amounts of weight loss can significantly improve the severity of sleep apnea.  7. Snoring recommendationsinclude: weight loss where appropriate, side sleeping, and avoidance of alcohol before bed.  8. Operation of motor vehicle or dangerous equipment must be avoided when feeling drowsy, excessively sleepy, or mentally fatigued.  Report prepared by: Signature: Armanda Magic, md; [facility]; Diplomat, American Board of Sleep Medicine  Electronically Signed: Jun 2, 202

## 2021-05-13 ENCOUNTER — Encounter: Payer: Self-pay | Admitting: *Deleted

## 2021-05-13 ENCOUNTER — Telehealth: Payer: Self-pay | Admitting: *Deleted

## 2021-05-13 DIAGNOSIS — G4733 Obstructive sleep apnea (adult) (pediatric): Secondary | ICD-10-CM

## 2021-05-13 NOTE — Telephone Encounter (Signed)
-----   Message from Quintella Reichert, MD sent at 05/01/2021 11:55 AM EDT ----- Please let patient know that they have sleep apnea.  Recommend therapeutic CPAP titration for treatment of patient's sleep disordered breathing.  If unable to perform an in lab titration then initiate ResMed auto CPAP from 4 to 15cm H2O with heated humidity and mask of choice and overnight pulse ox on CPAP.

## 2021-05-13 NOTE — Telephone Encounter (Addendum)
The patient has been notified of the result and verbalized understanding.  All questions (if any) were answered. Latrelle Dodrill, CMA 05/13/2021 4:30 PM   Per dpr lm on vm Titration sent to sleep pool.

## 2021-05-13 NOTE — Telephone Encounter (Signed)
-----   Message from Traci R Turner, MD sent at 05/01/2021 11:55 AM EDT ----- Please let patient know that they have sleep apnea.  Recommend therapeutic CPAP titration for treatment of patient's sleep disordered breathing.  If unable to perform an in lab titration then initiate ResMed auto CPAP from 4 to 15cm H2O with heated humidity and mask of choice and overnight pulse ox on CPAP.    

## 2021-05-13 NOTE — Telephone Encounter (Signed)
This encounter was created in error - please disregard.

## 2021-05-19 ENCOUNTER — Telehealth: Payer: Self-pay | Admitting: *Deleted

## 2021-05-19 NOTE — Telephone Encounter (Signed)
-----   Message from Reesa Chew, CMA sent at 05/13/2021  4:29 PM EDT ----- Regarding: precert Recommend therapeutic CPAP titration

## 2021-05-19 NOTE — Telephone Encounter (Signed)
Per Lendon Ka on 05/19/21 no precert is required for CPAP titration. Ok to schedule.

## 2021-06-03 MED ORDER — FLECAINIDE ACETATE 150 MG PO TABS: 75.0000 mg | ORAL_TABLET | Freq: Two times a day (BID) | ORAL | 3 refills | Status: DC

## 2021-06-03 NOTE — Addendum Note (Signed)
Addended by: Roney Mans A on: 06/03/2021 01:24 PM   Modules accepted: Orders

## 2021-06-04 NOTE — Telephone Encounter (Signed)
Patient is scheduled for lab study on 08/10/21. Patient understands his sleep study will be done at [facility] sleep lab. Patient understands he will receive a sleep packet in a week or so. Patient understands to call if he does not receive the sleep packet in a timely manner. Patient agrees with treatment and thanked me for call. PER DPR Left detailed message on voicemail with date and time of titration and informed patient to call back to confirm or reschedule.

## 2021-06-07 IMAGING — DX DG CHEST 2V
3 series · 3 of 3 positions shown · non-contrast
Comparison: Chest CTA 04/22/2019.  No prior chest radiographs.

CLINICAL DATA: Midsternal chest pain/pressure with anxiety for 2
days.

EXAM:
CHEST - 2 VIEW

[chest pa]
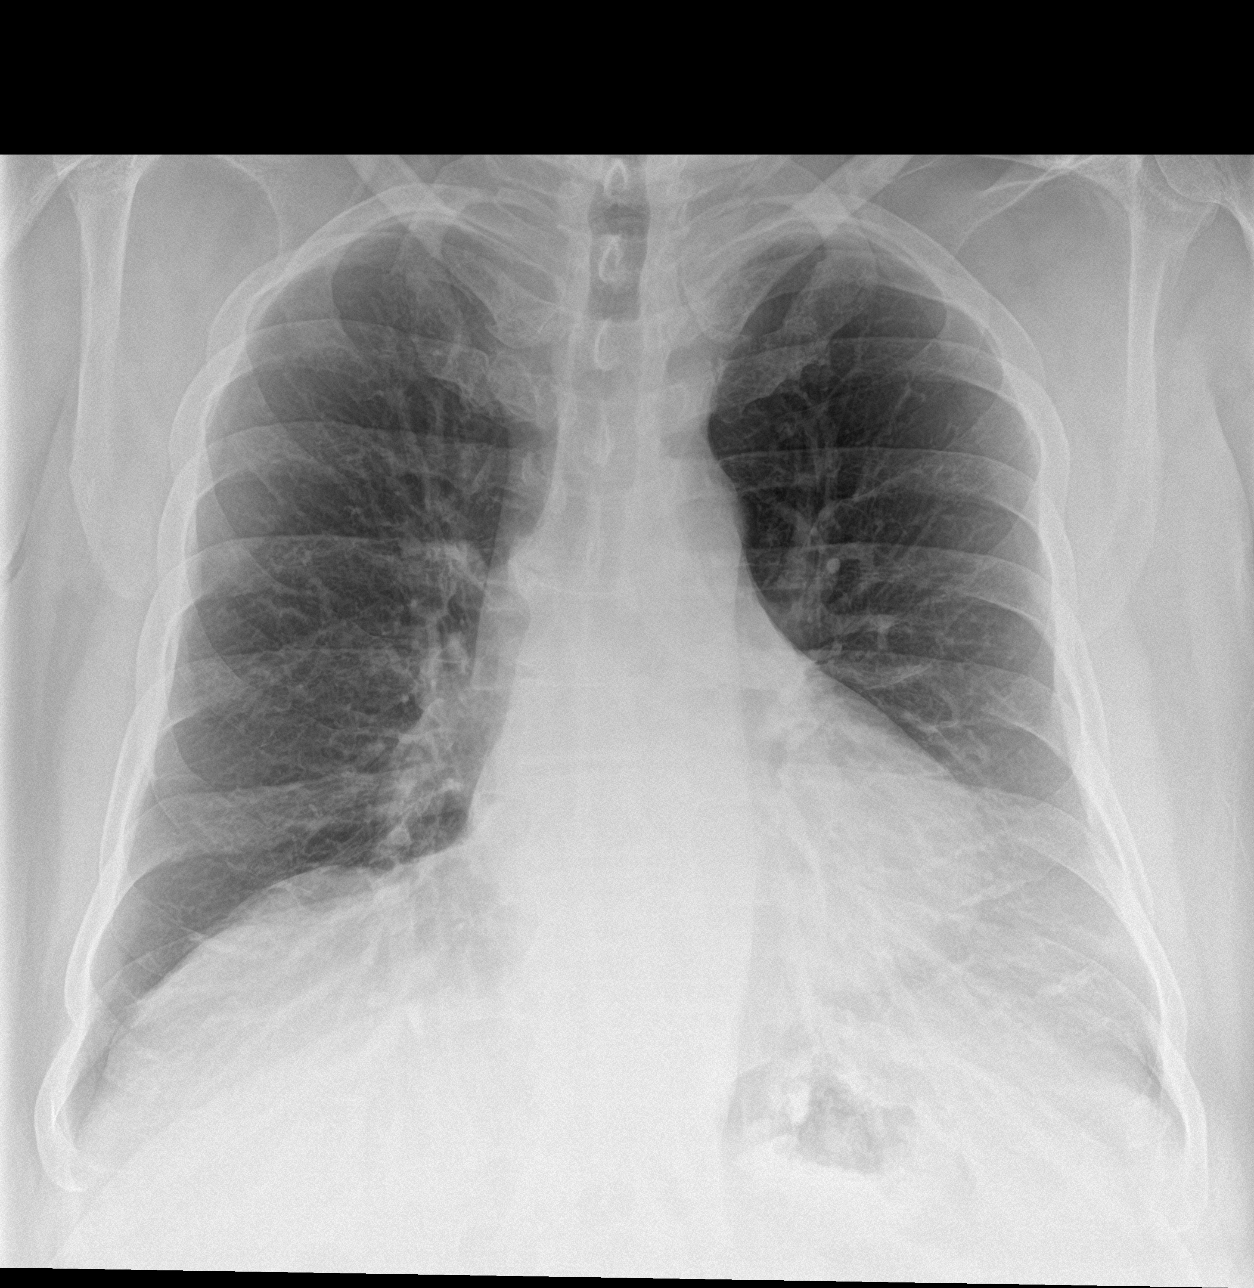

[chest lat (1 of 2)]
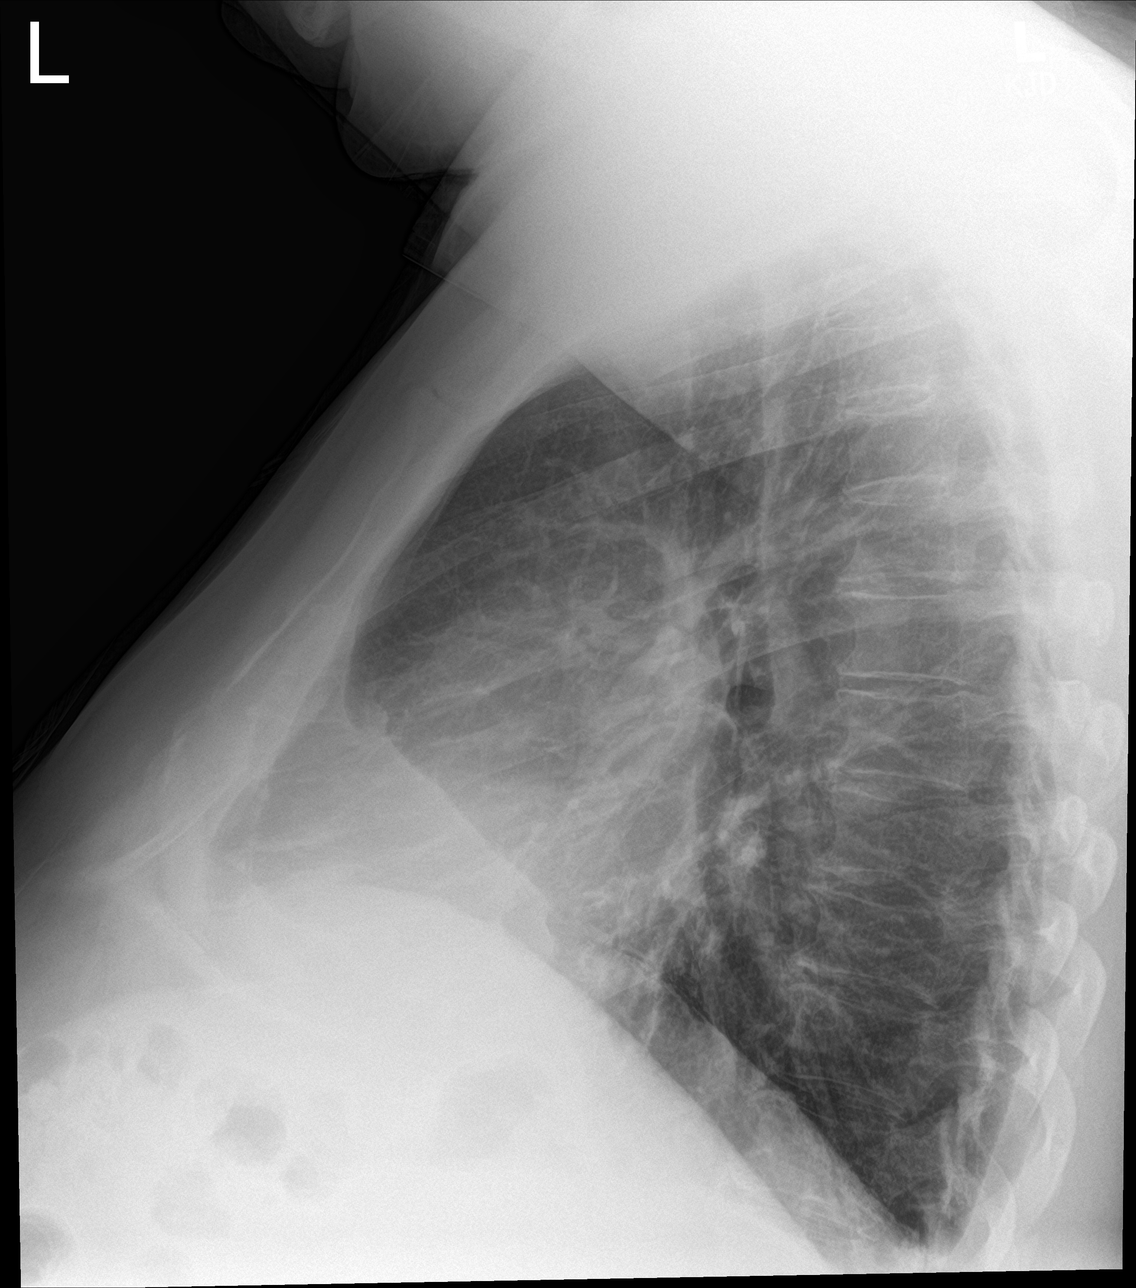

[chest lat (2 of 2)]
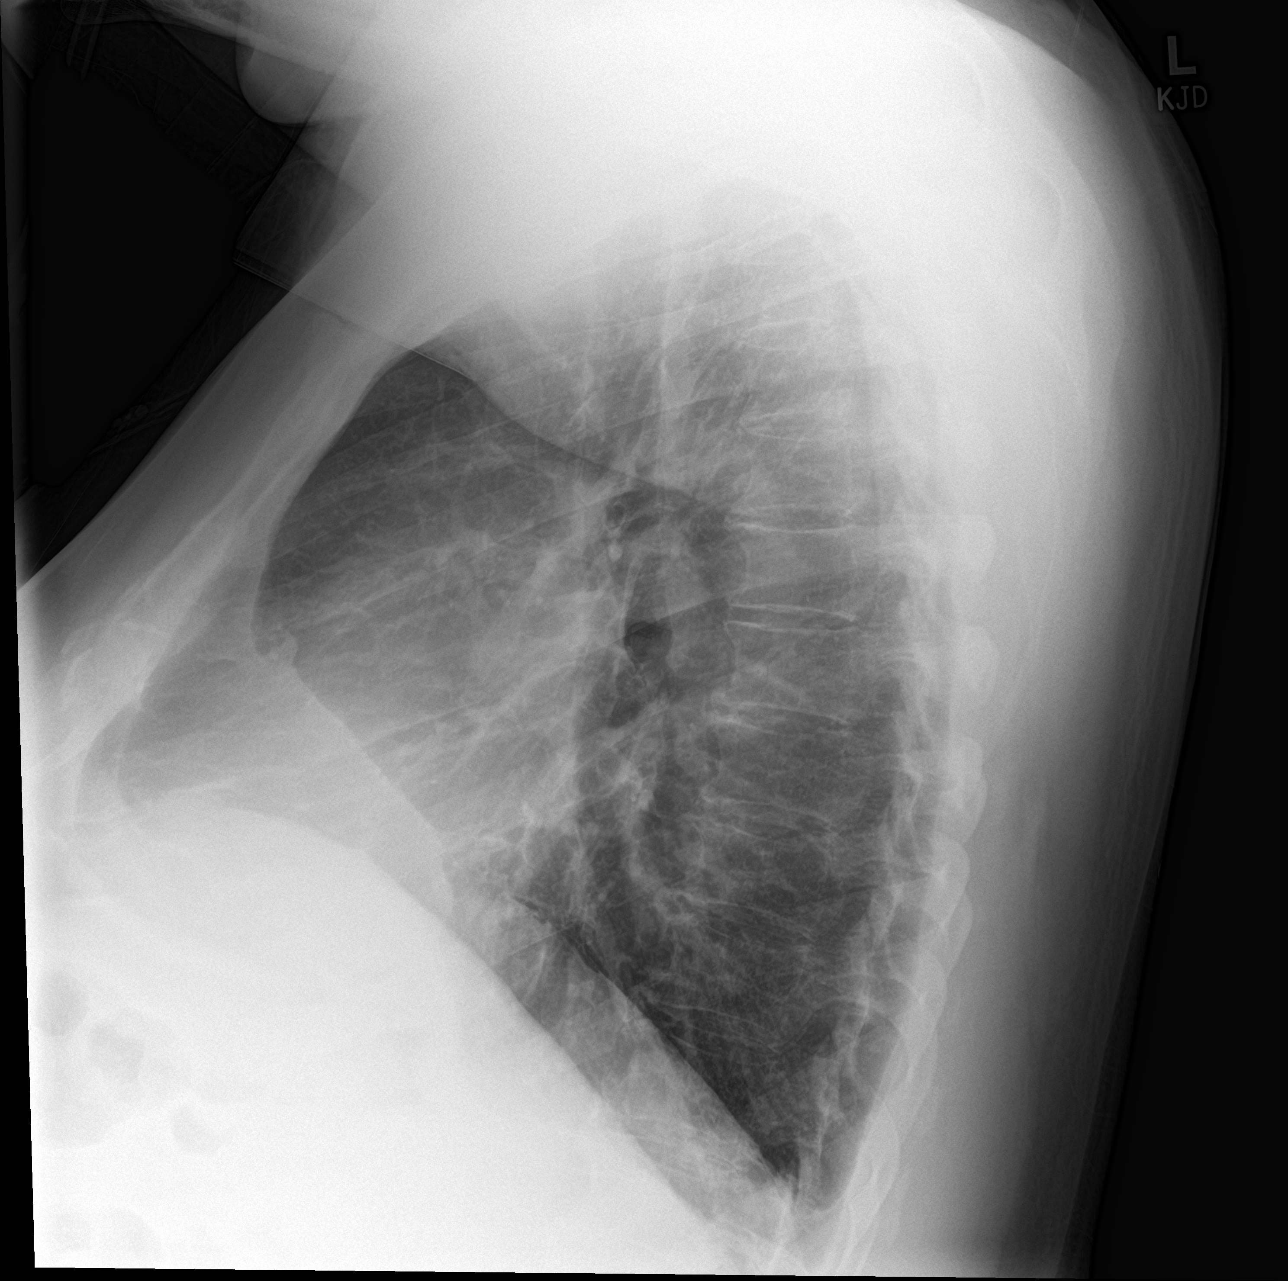

[3 of 3 positions shown; findings below may reference images not displayed]

FINDINGS: The lateral view was repeated. The heart size and mediastinal
contours are normal. The lungs are clear. There is no pleural
effusion or pneumothorax. No acute osseous findings are identified.
Stable mildly prominent fat along the left ventricular apex.
IMPRESSION: No active cardiopulmonary process.

## 2021-06-26 MED ORDER — METOPROLOL SUCCINATE ER 50 MG PO TB24: 50.0000 mg | ORAL_TABLET | Freq: Every day | ORAL | 2 refills | Status: DC

## 2021-06-26 NOTE — Addendum Note (Signed)
Addended by: Roney Mans A on: 06/26/2021 11:42 AM   Modules accepted: Orders

## 2021-07-29 ENCOUNTER — Other Ambulatory Visit: Payer: Self-pay

## 2021-07-29 ENCOUNTER — Ambulatory Visit (INDEPENDENT_AMBULATORY_CARE_PROVIDER_SITE_OTHER): Payer: No Typology Code available for payment source | Admitting: Cardiology

## 2021-07-29 VITALS — BP 120/78 | HR 59 | Ht 70.0 in | Wt 291.0 lb

## 2021-07-29 DIAGNOSIS — I4892 Unspecified atrial flutter: Secondary | ICD-10-CM | POA: Diagnosis not present

## 2021-07-29 DIAGNOSIS — I480 Paroxysmal atrial fibrillation: Secondary | ICD-10-CM | POA: Diagnosis not present

## 2021-07-29 DIAGNOSIS — G4733 Obstructive sleep apnea (adult) (pediatric): Secondary | ICD-10-CM

## 2021-07-29 DIAGNOSIS — Z79899 Other long term (current) drug therapy: Secondary | ICD-10-CM

## 2021-07-29 NOTE — Progress Notes (Signed)
Electrophysiology Office Follow up Visit Note:    Date:  07/29/2021   ID:  Earl Hogan, DOB [birth year], MRN [id number]  PCP:  Mila Palmer, MD  [facility] Cardiologist:  None  [facility] Electrophysiologist:  Lanier Prude, MD    Interval History:    Earl Hogan is a 60 y.o. male who presents for a follow up visit.  I last saw him in clinic on Apr 15, 2021 for paroxysmal atrial fibrillation and flutter.  He is maintained on Eliquis for stroke prophylaxis.  He takes flecainide 75 mg by mouth twice daily for rhythm control.  He is also on metoprolol succinate 50 mg by mouth once daily.  Today he is in normal rhythm with a ventricular rate of 59 bpm.  Today he is doing well.  He has had a couple of episodes of atrial fibrillation.  One was at [employer].  Another was when he was on a flight.  The episodes were short-lived but he had symptomatic tachycardia.  He wears an apple watch which confirmed an abnormal rhythm.  The patient tells me he is struggling to lose weight.  He is interested in potential referral to weight loss clinic.   Past Medical History:  Diagnosis Date   Back pain    Depression    History of kidney stones    Hx of seasonal allergies    Hypercholesteremia    Mixed hyperlipidemia 04/04/2021   OA (osteoarthritis)    Pre-diabetes    Umbilical hernia    Wears glasses     Past Surgical History:  Procedure Laterality Date   CARDIAC CATHETERIZATION     prior to 2010   COLONOSCOPY     UMBILICAL HERNIA REPAIR N/A 05/17/2019   Procedure: LAPAROSCOPIC UMBILICAL HERNIA REPAIR WITH MESH;  Surgeon: Axel Filler, MD;  Location: [facility] OR;  Service: General;  Laterality: N/A;   WISDOM TOOTH EXTRACTION     WRIST SURGERY     left    Current Medications: Current Meds  Medication Sig   acetaminophen (TYLENOL) 500 MG tablet Take 500 mg by mouth every 6 (six) hours as needed (back pain.).    apixaban (ELIQUIS) 5 MG TABS tablet Take 1 tablet (5 mg  total) by mouth 2 (two) times daily.   atorvastatin (LIPITOR) 20 MG tablet Take 20 mg by mouth daily.   cetirizine (ZYRTEC) 10 MG tablet Take 10 mg by mouth at bedtime.   Cholecalciferol (VITAMIN D3 PO) Take 1 tablet by mouth daily.   escitalopram (LEXAPRO) 10 MG tablet Take 10 mg by mouth at bedtime.    fexofenadine (ALLEGRA) 180 MG tablet Take 180 mg by mouth daily as needed for allergies.   flecainide (TAMBOCOR) 150 MG tablet Take 0.5 tablets (75 mg total) by mouth every 12 (twelve) hours.   meloxicam (MOBIC) 15 MG tablet Take 15 mg by mouth every other day.   metoprolol succinate (TOPROL-XL) 50 MG 24 hr tablet Take 1 tablet (50 mg total) by mouth daily. Take with or immediately following a meal.   tadalafil (CIALIS) 20 MG tablet Take 20 mg by mouth daily as needed for erectile dysfunction.   Tamsulosin HCl (FLOMAX) 0.4 MG CAPS Take 1 capsule (0.4 mg total) by mouth daily after breakfast. (Patient taking differently: Take 0.4 mg by mouth at bedtime.)     Allergies:   Penicillins   Social History   Socioeconomic History   Marital status: Married    Spouse name: Not on file  Number of children: Not on file   Years of education: Not on file   Highest education level: Not on file  Occupational History   Occupation: Water engineer  Tobacco Use   Smoking status: Never   Smokeless tobacco: Never  Vaping Use   Vaping Use: Never used  Substance and Sexual Activity   Alcohol use: Yes    Comment: occasional   Drug use: No   Sexual activity: Not on file  Other Topics Concern   Not on file  Social History Narrative   Not on file   Social Determinants of Health   Financial Resource Strain: Not on file  Food Insecurity: Not on file  Transportation Needs: Not on file  Physical Activity: Not on file  Stress: Not on file  Social Connections: Not on file     Family History: The patient's family history includes Cancer in his father; Heart disease (age of  onset: 24) in his mother.  ROS:   Please see the history of present illness.    All other systems reviewed and are negative.  EKGs/Labs/Other Studies Reviewed:    The following studies were reviewed today:  May 01, 2021 SPECT Low risk for ischemia  EKG:  The ekg ordered today demonstrates sinus rhythm.  PR interval 206 ms.  QRS duration 98 ms.  Recent Labs: 04/05/2021: ALT 30; B Natriuretic Peptide 145.5; TSH 2.271 04/06/2021: BUN 14; Creatinine, Ser 1.39; Hemoglobin 15.6; Magnesium 2.2; Platelets 330; Potassium 5.2; Sodium 139  Recent Lipid Panel No results found for: CHOL, TRIG, HDL, CHOLHDL, VLDL, LDLCALC, LDLDIRECT  Physical Exam:    VS:  BP 120/78   Pulse (!) 59   Ht 5\' 10"  (1.778 m)   Wt 291 lb (132 kg)   BMI 41.75 kg/m     Wt Readings from Last 3 Encounters:  07/29/21 291 lb (132 kg)  04/30/21 296 lb (134.3 kg)  04/15/21 296 lb (134.3 kg)     GEN:  Well nourished, well developed in no acute distress HEENT: Normal NECK: No JVD; No carotid bruits LYMPHATICS: No lymphadenopathy CARDIAC: RRR, no murmurs, rubs, gallops RESPIRATORY:  Clear to auscultation without rales, wheezing or rhonchi  ABDOMEN: Soft, non-tender, non-distended MUSCULOSKELETAL:  No edema; No deformity  SKIN: Warm and dry NEUROLOGIC:  Alert and oriented x 3 PSYCHIATRIC:  Normal affect   ASSESSMENT:    1. Paroxysmal atrial fibrillation (HCC)   2. Atrial flutter, unspecified type (HCC)   3. OSA (obstructive sleep apnea)   4. Encounter for long-term (current) use of high-risk medication    PLAN:    In order of problems listed above:   1. Paroxysmal atrial fibrillation (HCC) 2. Atrial flutter, unspecified type (HCC)  Paroxysmal.  Symptomatic.  Doing well on flecainide and metoprolol.  On Eliquis for stroke prophylaxis.  I would continue the above regimen for now.  I discussed the treatment options for him again today.  We can continue flecainide and metoprolol for now which I think is very  reasonable.  If his burden of atrial fibrillation would increase moving forward, would favor an invasive approach.  I did discuss ablation with the patient including the data for efficacy compared to antiarrhythmic drugs during today's visit.  I will have him follow-up with one of the PAs in 6 months.  He knows to reach out if his symptoms become more frequent.  I will refer him to the weight loss clinic here at [facility] to assist him in his weight  loss efforts.  3. OSA (obstructive sleep apnea)    Follow-up 6 months.     Medication Adjustments/Labs and Tests Ordered: Current medicines are reviewed at length with the patient today.  Concerns regarding medicines are outlined above.  Orders Placed This Encounter  Procedures   EKG 12-Lead    No orders of the defined types were placed in this encounter.    Signed, Steffanie Dunn, MD, [facility], [facility] 07/29/2021 8:44 AM    Electrophysiology [facility]

## 2021-07-29 NOTE — Patient Instructions (Addendum)
Medication Instructions:  Your physician recommends that you continue on your current medications as directed. Please refer to the Current Medication list given to you today.  Labwork: None ordered.  Testing/Procedures: None ordered.  Follow-Up: Your physician wants you to follow-up in: 01/27/22 at 8 am with   Casimiro Needle "Mardelle Matte" Lanna Poche, PA-C     Any Other Special Instructions Will Be Listed Below (If Applicable).  If you need a refill on your cardiac medications before your next appointment, please call your pharmacy.

## 2021-08-01 ENCOUNTER — Telehealth: Payer: Self-pay | Admitting: *Deleted

## 2021-08-01 NOTE — Telephone Encounter (Signed)
   [facility] Pre-operative Risk Assessment    Patient Name: MONTRELL CESSNA  DOB: 1961/11/12 MRN: [id number]  HEARTCARE STAFF:  - IMPORTANT!!!!!! Under Visit Info/Reason for Call, type in Other and utilize the format Clearance MM/DD/YY or Clearance TBD. Do not use dashes or single digits. - Please review there is not already an duplicate clearance open for this procedure. - If request is for dental extraction, please clarify the # of teeth to be extracted. - If the patient is currently at the dentist's office, call Pre-Op Callback Staff (MA/nurse) to input urgent request.  - If the patient is not currently in the dentist office, please route to the Pre-Op pool.  Request for surgical clearance:  What type of surgery is being performed? COLONOSCOPY  When is this surgery scheduled? 10/21/21  What type of clearance is required (medical clearance vs. Pharmacy clearance to hold med vs. Both)? BOTH  Are there any medications that need to be held prior to surgery and how long?  ELIQUIS x 2 DAYS PRIOR TO PROCEDURE  Practice name and name of physician performing surgery? [facility]; DR. Michail Sermon  What is the office phone number? [phone]   7.   What is the office fax number? [phone]  8.   Anesthesia type (None, local, MAC, general) ? NOT LISTED (PROPOFOL?)   Julaine Hua 08/01/2021, 4:46 PM  _________________________________________________________________   (provider comments below)

## 2021-08-05 MED ORDER — APIXABAN 5 MG PO TABS: 5.0000 mg | ORAL_TABLET | Freq: Two times a day (BID) | ORAL | 5 refills | Status: DC

## 2021-08-05 NOTE — Telephone Encounter (Signed)
Left VM

## 2021-08-05 NOTE — Telephone Encounter (Signed)
Patient with diagnosis of afib on Eliquis for anticoagulation.    Procedure: colonscopy Date of procedure: 10/21/21  CHA2DS2-VASc Score = 0  This indicates a 0.2% annual risk of stroke. The patient's score is based upon: CHF History: No HTN History: No Diabetes History: No Stroke History: No Vascular Disease History: No Age Score: 0 Gender Score: 0  CrCl 106 mL/min using adjusted body weight due to obesity Platelet count 282K  Per office protocol, patient can hold Eliquis for 2 days prior to procedure.    Patient should restart Eliquis on the evening of procedure or day after, at discretion of procedure MD.

## 2021-08-07 NOTE — Telephone Encounter (Signed)
   Name: Earl Hogan  DOB: 1961/11/01  MRN: [id number]   Primary Cardiologist: Armanda Magic, MD  Chart reviewed as part of pre-operative protocol coverage. Patient was contacted 08/07/2021 in reference to pre-operative risk assessment for pending surgery as outlined below.  Earl Hogan was last seen on 07/29/21 by [name].  Since that day, Earl Hogan has done well.  He does not have a history of ischemic heart disease and had a nonischemic nuclear stress test 04/2021. He can complete more than 4.0 METS without angina.   Per our clinical pharmacist:  Per office protocol, patient can hold Eliquis for 2 days prior to procedure.     Patient should restart Eliquis on the evening of procedure or day after, at discretion of procedure MD.   Therefore, based on ACC/AHA guidelines, the patient would be at acceptable risk for the planned procedure without further cardiovascular testing.   The patient was advised that if he develops new symptoms prior to surgery to contact our office to arrange for a follow-up visit, and he verbalized understanding.  I will route this recommendation to the requesting party via Epic fax function and remove from pre-op pool. Please call with questions.  Roe Rutherford Tranae Lar[place]ie, PA 08/07/2021, 2:52 PM

## 2021-08-07 NOTE — Telephone Encounter (Signed)
Left VM

## 2021-08-07 NOTE — Telephone Encounter (Signed)
Patient was returning call 

## 2021-08-10 ENCOUNTER — Ambulatory Visit (HOSPITAL_BASED_OUTPATIENT_CLINIC_OR_DEPARTMENT_OTHER): Payer: PRIVATE HEALTH INSURANCE | Attending: Cardiology | Admitting: Cardiology

## 2021-08-10 ENCOUNTER — Other Ambulatory Visit: Payer: Self-pay

## 2021-08-10 DIAGNOSIS — G4752 REM sleep behavior disorder: Secondary | ICD-10-CM | POA: Insufficient documentation

## 2021-08-10 DIAGNOSIS — G4733 Obstructive sleep apnea (adult) (pediatric): Secondary | ICD-10-CM | POA: Diagnosis present

## 2021-08-24 NOTE — Procedures (Signed)
     Patient Name: Earl Hogan, Earl Hogan Date: 08/10/2021 Gender: Male D.O.B: Apr 13, 1961 Age (years): 48 Referring Provider: Armanda Magic MD, ABSM Height (inches): 70 Interpreting Physician: Armanda Magic MD, ABSM Weight (lbs): 283 RPSGT: [place], [place] BMI: 41 MRN: [id number] Neck Size: 18.25  CLINICAL INFORMATION The patient is referred for a CPAP titration to treat sleep apnea.  SLEEP STUDY TECHNIQUE As per the AASM Manual for the Scoring of Sleep and Associated Events v2.3 (April 2016) with a hypopnea requiring 4% desaturations.  The channels recorded and monitored were frontal, central and occipital EEG, electrooculogram (EOG), submentalis EMG (chin), nasal and oral airflow, thoracic and abdominal wall motion, anterior tibialis EMG, snore microphone, electrocardiogram, and pulse oximetry. Continuous positive airway pressure (CPAP) was initiated at the beginning of the study and titrated to treat sleep-disordered breathing.  MEDICATIONS Medications self-administered by patient taken the night of the study : N/A  TECHNICIAN COMMENTS Comments added by technician: Patient talked in his/her sleep. Patient was restless all through the night. Comments added by scorer: N/A  RESPIRATORY PARAMETERS Optimal PAP Pressure (cm):16  AHI at Optimal Pressure (/hr):0 Overall Minimal O2 (%):92.0  Supine % at Optimal Pressure (%):0 Minimal O2 at Optimal Pressure (%): 94.0   SLEEP ARCHITECTURE The study was initiated at 11:07:38 PM and ended at 5:08:50 AM.  Sleep onset time was 13.9 minutes and the sleep efficiency was 83.7%. The total sleep time was 302.5 minutes.  The patient spent 9.3%% of the night in stage N1 sleep, 57.0% in stage N2 sleep, 0.0% in stage N3 and 33.7% in REM.Stage REM latency was 92.0 minutes  Wake after sleep onset was 44.8. Alpha intrusion was absent. Supine sleep was 4.13%.  CARDIAC DATA The 2 lead EKG demonstrated sinus rhythm. The mean heart rate was 55.9 beats  per minute. Other EKG findings include: None.  LEG MOVEMENT DATA The total Periodic Limb Movements of Sleep (PLMS) were 0. The PLMS index was 0.0. A PLMS index of <15 is considered normal in adults.  IMPRESSIONS - The optimal PAP pressure was 16 cm of water. - Significant oxygen desaturations were not observed during this titration (min O2 = 92.0%). - The patient snored with moderate snoring volume during this titration study. - No cardiac abnormalities were observed during this study. - Clinically significant periodic limb movements were not noted during this study. Arousals associated with PLMs were significant.  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 - Avoid alcohol, sedatives and other CNS depressants that may worsen sleep apnea and disrupt normal sleep architecture. - Sleep hygiene should be reviewed to assess factors that may improve sleep quality. - Weight management and regular exercise should be initiated or continued. - Return to Sleep Center for re-evaluation after 4 weeks of therapy  [Electronically signed] 08/24/2021 09:19 PM

## 2021-08-29 NOTE — Telephone Encounter (Signed)
Turner, Traci R, MD  Markea Ruzich G, CMA Please let patient know that they had a successful PAP titration and let DME know that orders are in EPIC.  Please set up 6 week OV with me.   

## 2021-08-29 NOTE — Telephone Encounter (Signed)
The patient has been notified of the result and verbalized understanding. Left detailed message on voicemail and informed patient to call back with questions   Earl Hogan, CMA 08/29/2021 2:18 PM

## 2021-08-29 NOTE — Telephone Encounter (Signed)
Upon patient request DME selection is [facility] Patient understands he will be contacted by [facility] to set up his cpap. Patient understands to call if [facility] does not contact him with new setup in a timely manner. Patient understands they will be called once confirmation has been received from [facility] that they have received their new machine to schedule 10 week follow up appointment.    [facility] notified of new cpap order  Please add to [facility] Patient was grateful for the call and thanked me

## 2021-10-01 ENCOUNTER — Telehealth: Payer: Self-pay | Admitting: Cardiology

## 2021-10-01 NOTE — Telephone Encounter (Signed)
STAT if HR is under 50 or over 120 (normal HR is 60-100 beats per minute)  What is your heart rate?  Unsure   Do you have a log of your heart rate readings (document readings)?  No log available, but patient states his HR has gotten up to 120. He states it has been sporadic. Sometimes is is in the 60's, then it is normal. He states it will go from normal to elevated.  Do you have any other symptoms?  No

## 2021-11-07 ENCOUNTER — Telehealth: Payer: Self-pay

## 2021-11-07 NOTE — Telephone Encounter (Signed)
Per Pt-Eliquis has been refilled with no recent incidence.  He will call if any further issues.

## 2022-01-25 ENCOUNTER — Other Ambulatory Visit: Payer: Self-pay | Admitting: Cardiology

## 2022-01-26 NOTE — Telephone Encounter (Signed)
Pt last saw Dr Lalla Brothers 07/29/21, last labs 04/06/21 Creat 1.39, age 62, weight 128.4kg, based on specified criteria pt is on appropriate dosage of Eliquis 5mg  BID for afib.  Will refill rx.

## 2022-01-26 NOTE — Progress Notes (Signed)
PCP:  Jonathon Jordan, MD Primary Cardiologist: Fransico Him, MD Electrophysiologist: Vickie Epley, MD   ALANO KYRIACOU is a 61 y.o. male seen today for Vickie Epley, MD for routine electrophysiology followup.  Since last being seen in our clinic the patient reports doing very well. Had one breakthrough episode in October felt to be 2/2 stress. Declines CPAP. Had bad experience with the testing, and never heard back from the [place]. Intent on losing weight. he denies chest pain, palpitations, dyspnea, PND, orthopnea, nausea, vomiting, dizziness, syncope, edema, weight gain, or early satiety.  Past Medical History:  Diagnosis Date   Back pain    Depression    History of kidney stones    Hx of seasonal allergies    Hypercholesteremia    Mixed hyperlipidemia 04/04/2021   OA (osteoarthritis)    Pre-diabetes    Umbilical hernia    Wears glasses    Past Surgical History:  Procedure Laterality Date   CARDIAC CATHETERIZATION     prior to 2010   COLONOSCOPY     UMBILICAL HERNIA REPAIR N/A 05/17/2019   Procedure: LAPAROSCOPIC UMBILICAL HERNIA REPAIR WITH MESH;  Surgeon: Ralene Ok, MD;  Location: [facility];  Service: General;  Laterality: N/A;   WISDOM TOOTH EXTRACTION     WRIST SURGERY     left    Current Outpatient Medications  Medication Sig Dispense Refill   acetaminophen (TYLENOL) 500 MG tablet Take 500 mg by mouth every 6 (six) hours as needed (back pain.).      atorvastatin (LIPITOR) 20 MG tablet Take 20 mg by mouth daily.     cetirizine (ZYRTEC) 10 MG tablet Take 10 mg by mouth at bedtime.     Cholecalciferol (VITAMIN D3 PO) Take 1 tablet by mouth daily.     ELIQUIS 5 MG TABS tablet TAKE 1 TABLET(5 MG) BY MOUTH TWICE DAILY 60 tablet 5   escitalopram (LEXAPRO) 10 MG tablet Take 10 mg by mouth at bedtime.      fexofenadine (ALLEGRA) 180 MG tablet Take 180 mg by mouth daily as needed for allergies.     flecainide (TAMBOCOR) 150 MG tablet Take 0.5 tablets (75  mg total) by mouth every 12 (twelve) hours. 90 tablet 3   meloxicam (MOBIC) 15 MG tablet Take 15 mg by mouth every other day.     metoprolol succinate (TOPROL-XL) 50 MG 24 hr tablet Take 1 tablet (50 mg total) by mouth daily. Take with or immediately following a meal. 90 tablet 2   tadalafil (CIALIS) 20 MG tablet Take 20 mg by mouth daily as needed for erectile dysfunction.     Tamsulosin HCl (FLOMAX) 0.4 MG CAPS Take 1 capsule (0.4 mg total) by mouth daily after breakfast. (Patient taking differently: Take 0.4 mg by mouth at bedtime.) 10 capsule 0   No current facility-administered medications for this visit.    Allergies  Allergen Reactions   Penicillins Other (See Comments)    Unknown childhood Did it involve swelling of the face/tongue/throat, SOB, or low BP? Unknown Did it involve sudden or severe rash/hives, skin peeling, or any reaction on the inside of your mouth or nose? Unknown Did you need to seek medical attention at a hospital or doctor's office? Unknown When did it last happen? Childhood reaction. If all above answers are NO, may proceed with cephalosporin use.     Social History   Socioeconomic History   Marital status: Married    Spouse name: Not on file  Number of children: Not on file   Years of education: Not on file   Highest education level: Not on file  Occupational History   Occupation: Restaurant manager, fast food  Tobacco Use   Smoking status: Never   Smokeless tobacco: Never  Vaping Use   Vaping Use: Never used  Substance and Sexual Activity   Alcohol use: Yes    Comment: occasional   Drug use: No   Sexual activity: Not on file  Other Topics Concern   Not on file  Social History Narrative   Not on file   Social Determinants of Health   Financial Resource Strain: Not on file  Food Insecurity: Not on file  Transportation Needs: Not on file  Physical Activity: Not on file  Stress: Not on file  Social Connections: Not on file   Intimate Partner Violence: Not on file     Review of Systems: All other systems reviewed and are otherwise negative except as noted above.  Physical Exam: Vitals:   01/27/22 0800  BP: 124/70  Pulse: 65  SpO2: 95%  Weight: 285 lb (129.3 kg)  Height: 5\' 10"  (1.778 m)    GEN- The patient is well appearing, alert and oriented x 3 today.   HEENT: normocephalic, atraumatic; sclera clear, conjunctiva pink; hearing intact; oropharynx clear; neck supple, no JVP Lymph- no cervical lymphadenopathy Lungs- Clear to ausculation bilaterally, normal work of breathing.  No wheezes, rales, rhonchi Heart- Regular rate and rhythm, no murmurs, rubs or gallops, PMI not laterally displaced GI- soft, non-tender, non-distended, bowel sounds present, no hepatosplenomegaly Extremities- no clubbing, cyanosis, or edema; DP/PT/radial pulses 2+ bilaterally MS- no significant deformity or atrophy Skin- warm and dry, no rash or lesion Psych- euthymic mood, full affect Neuro- strength and sensation are intact  EKG is ordered. Personal review of EKG from today shows NSR at 65 with stable intervals compared to prior  Additional studies reviewed include: Previous EP office notes.   Assessment and Plan:  1. Paroxysmal atrial fibrillation (HCC) 2. Atrial flutter, unspecified type (HCC)  Paroxysmal and symptomatic. EKG today shows NSR and stable on flecainide Continue Eliquis for stroke prophylaxis. He would be a candidate for ablation per previous Dr. Quentin Ore notes.    3. OSA (obstructive sleep apnea) Declines CPAP  4. Obesity Body mass index is 40.89 kg/m.  Has been referred to Weight loss clinic. Working with PCP as well.  Follow up with Dr. Quentin Ore in 6 months   Shirley Friar, PA-C  01/26/22 9:06 AM

## 2022-01-27 ENCOUNTER — Ambulatory Visit: Payer: No Typology Code available for payment source | Admitting: Student

## 2022-01-27 ENCOUNTER — Other Ambulatory Visit: Payer: Self-pay

## 2022-01-27 ENCOUNTER — Encounter: Payer: Self-pay | Admitting: Student

## 2022-01-27 VITALS — BP 124/70 | HR 65 | Ht 70.0 in | Wt 285.0 lb

## 2022-01-27 DIAGNOSIS — G4733 Obstructive sleep apnea (adult) (pediatric): Secondary | ICD-10-CM | POA: Diagnosis not present

## 2022-01-27 DIAGNOSIS — I480 Paroxysmal atrial fibrillation: Secondary | ICD-10-CM | POA: Diagnosis not present

## 2022-01-27 DIAGNOSIS — I4892 Unspecified atrial flutter: Secondary | ICD-10-CM

## 2022-01-27 LAB — CBC
Hematocrit: 45.6 % (ref 37.5–51.0)
Hemoglobin: 15.3 g/dL (ref 13.0–17.7)
MCH: 28.6 pg (ref 26.6–33.0)
MCHC: 33.6 g/dL (ref 31.5–35.7)
MCV: 85 fL (ref 79–97)
Platelets: 322 10*3/uL (ref 150–450)
RBC: 5.35 x10E6/uL (ref 4.14–5.80)
RDW: 11.6 % (ref 11.6–15.4)
WBC: 6.7 10*3/uL (ref 3.4–10.8)

## 2022-01-27 LAB — BASIC METABOLIC PANEL
BUN/Creatinine Ratio: 15 (ref 10–24)
BUN: 16 mg/dL (ref 8–27)
CO2: 24 mmol/L (ref 20–29)
Calcium: 9.7 mg/dL (ref 8.6–10.2)
Chloride: 102 mmol/L (ref 96–106)
Creatinine, Ser: 1.08 mg/dL (ref 0.76–1.27)
Glucose: 101 mg/dL — ABNORMAL HIGH (ref 70–99)
Potassium: 5.1 mmol/L (ref 3.5–5.2)
Sodium: 138 mmol/L (ref 134–144)
eGFR: 78 mL/min/{1.73_m2} (ref >59)

## 2022-01-27 NOTE — Patient Instructions (Signed)
Medication Instructions:  Your physician recommends that you continue on your current medications as directed. Please refer to the Current Medication list given to you today.  *If you need a refill on your cardiac medications before your next appointment, please call your pharmacy*   Lab Work: TODAY: BMET, CBC  If you have labs (blood work) drawn today and your tests are completely normal, you will receive your results only by: MyChart Message (if you have MyChart) OR A paper copy in the mail If you have any lab test that is abnormal or we need to change your treatment, we will call you to review the results.   Follow-Up: At [facility], you and your health needs are our priority.  As part of our continuing mission to provide you with exceptional heart care, we have created designated Provider Care Teams.  These Care Teams include your primary Cardiologist (physician) and Advanced Practice Providers (APPs -  Physician Assistants and Nurse Practitioners) who all work together to provide you with the care you need, when you need it.   Your next appointment:   6 month(s)  The format for your next appointment:   In Person  Provider:   Cameron Lambert, MD{   

## 2022-01-29 DIAGNOSIS — Z0289 Encounter for other administrative examinations: Secondary | ICD-10-CM

## 2022-02-10 ENCOUNTER — Ambulatory Visit (INDEPENDENT_AMBULATORY_CARE_PROVIDER_SITE_OTHER): Payer: No Typology Code available for payment source | Admitting: Family Medicine

## 2022-02-10 ENCOUNTER — Other Ambulatory Visit: Payer: Self-pay

## 2022-02-10 ENCOUNTER — Encounter (INDEPENDENT_AMBULATORY_CARE_PROVIDER_SITE_OTHER): Payer: Self-pay | Admitting: Family Medicine

## 2022-02-10 VITALS — BP 104/61 | HR 77 | Temp 97.6°F | Ht 71.0 in | Wt 275.0 lb

## 2022-02-10 DIAGNOSIS — G4733 Obstructive sleep apnea (adult) (pediatric): Secondary | ICD-10-CM

## 2022-02-10 DIAGNOSIS — I4891 Unspecified atrial fibrillation: Secondary | ICD-10-CM

## 2022-02-10 DIAGNOSIS — E559 Vitamin D deficiency, unspecified: Secondary | ICD-10-CM

## 2022-02-10 DIAGNOSIS — R7401 Elevation of levels of liver transaminase levels: Secondary | ICD-10-CM

## 2022-02-10 DIAGNOSIS — Z6838 Body mass index (BMI) 38.0-38.9, adult: Secondary | ICD-10-CM

## 2022-02-10 DIAGNOSIS — R0602 Shortness of breath: Secondary | ICD-10-CM | POA: Diagnosis not present

## 2022-02-10 DIAGNOSIS — E782 Mixed hyperlipidemia: Secondary | ICD-10-CM

## 2022-02-10 DIAGNOSIS — F3289 Other specified depressive episodes: Secondary | ICD-10-CM | POA: Diagnosis not present

## 2022-02-10 DIAGNOSIS — K5790 Diverticulosis of intestine, part unspecified, without perforation or abscess without bleeding: Secondary | ICD-10-CM

## 2022-02-10 DIAGNOSIS — E669 Obesity, unspecified: Secondary | ICD-10-CM

## 2022-02-10 DIAGNOSIS — R7303 Prediabetes: Secondary | ICD-10-CM

## 2022-02-10 DIAGNOSIS — R5383 Other fatigue: Secondary | ICD-10-CM

## 2022-02-11 LAB — COMPREHENSIVE METABOLIC PANEL
ALT: 48 IU/L — ABNORMAL HIGH (ref 0–44)
AST: 28 IU/L (ref 0–40)
Albumin/Globulin Ratio: 2 (ref 1.2–2.2)
Albumin: 4.6 g/dL (ref 3.8–4.8)
Alkaline Phosphatase: 85 IU/L (ref 44–121)
BUN/Creatinine Ratio: 12 (ref 10–24)
BUN: 11 mg/dL (ref 8–27)
Bilirubin Total: 0.7 mg/dL (ref 0.0–1.2)
CO2: 22 mmol/L (ref 20–29)
Calcium: 9.5 mg/dL (ref 8.6–10.2)
Chloride: 104 mmol/L (ref 96–106)
Creatinine, Ser: 0.92 mg/dL (ref 0.76–1.27)
Globulin, Total: 2.3 g/dL (ref 1.5–4.5)
Glucose: 90 mg/dL (ref 70–99)
Potassium: 4.8 mmol/L (ref 3.5–5.2)
Sodium: 142 mmol/L (ref 134–144)
Total Protein: 6.9 g/dL (ref 6.0–8.5)
eGFR: 94 mL/min/{1.73_m2} (ref >59)

## 2022-02-11 LAB — T3: T3, Total: 103 ng/dL (ref 71–180)

## 2022-02-11 LAB — FOLATE: Folate: 9.4 ng/mL (ref >3.0)

## 2022-02-11 LAB — INSULIN, RANDOM: INSULIN: 14.8 u[IU]/mL (ref 2.6–24.9)

## 2022-02-11 LAB — VITAMIN B12: Vitamin B-12: 214 pg/mL — ABNORMAL LOW (ref 232–1245)

## 2022-02-11 LAB — T4, FREE: Free T4: 1.07 ng/dL (ref 0.82–1.77)

## 2022-02-12 NOTE — Progress Notes (Signed)
Chief Complaint:   OBESITY Earl Hogan (MR# [id number]) is a 61 y.o. male who presents for evaluation and treatment of obesity and related comorbidities. Current BMI is Body mass index is 38.35 kg/m. Earl Hogan has been struggling with his weight for many years and has been unsuccessful in either losing weight, maintaining weight loss, or reaching his healthy weight goal.  Earl Hogan was referred by [name]. He lives with his wife and son. He is a Chemical engineer. He previously lost 50 lbs. 1-2 cups of coffee in the AM with 1/2 with sugar and with or without diluted orange juice. He is drinking herbal tea in the AM. Lunch is leftovers from home. Asian type food, 2 cups rice/noodles, 1/3 cup protein, 1/3 cup vegetables (feels full), and yogurt with fruit, with or without granola or honey. No real snacks in the afternoon. Blue apron in the PM 2 times per week or chicken (med breast), 2 cups of rice/pasta, and 1/2 cup of vegetables. After dinner: Nuts, or dry roasted edamame during the week, and popcorn on the weekend.   Earl Hogan is currently in the action stage of change and ready to dedicate time achieving and maintaining a healthier weight. Earl Hogan is interested in becoming our patient and working on intensive lifestyle modifications including (but not limited to) diet and exercise for weight loss.  Earl Hogan's habits were reviewed today and are as follows: His family eats meals together, he thinks his family will eat healthier with him, he struggles with family and or coworkers weight loss sabotage, his desired weight loss is 50 lbs, he started gaining weight between the age of 35-30, his heaviest weight ever was 310 pounds, he has significant food cravings issues, he snacks frequently in the evenings, he skips meals frequently, he frequently eats larger portions than normal, and he struggles with emotional eating.  Depression Screen [name] Food and Mood (modified PHQ-9) score was  8.  Depression screen PHQ 2/9 02/10/2022  Decreased Interest 2  Down, Depressed, Hopeless 1  PHQ - 2 Score 3  Altered sleeping 1  Tired, decreased energy 2  Change in appetite 1  Feeling bad or failure about yourself  1  Trouble concentrating 0  Moving slowly or fidgety/restless 0  Suicidal thoughts 0  PHQ-9 Score 8  Difficult doing work/chores Not difficult at all   Subjective:   1. Other fatigue Earl Hogan admits to daytime somnolence and admits to waking up refreshed and still tired. Patient has a history of symptoms of daytime fatigue and morning fatigue. [name] generally gets 6 hours of sleep per night, and states that he has nightime awakenings and generally restful sleep. Snoring is present. Apneic episodes are present. Epworth Sleepiness Score is 14. EKG was done on 01/27/2022-normal sinus rhythm with first degree block.  2. SOBOE (shortness of breath on exertion) Earl Hogan notes increasing shortness of breath with exercising and seems to be worsening over time with weight gain. He notes getting out of breath sooner with activity than he used to. This has not gotten worse recently. [name] denies shortness of breath at rest or orthopnea.  3. Pre-diabetes Earl Hogan last A1c was 6.0. He was diagnosed 5-6 years ago. He is on Mounjaro (6th or 7th dose in 2 days), with weight loss of 10 lbs.  4. Vitamin D deficiency Earl Hogan was diagnosed a few years ago. Recent Vit D level was of 37.8. He is on OTC Vit D 5,000 IU daily.   5. OSA (obstructive  sleep apnea) Earl Hogan was diagnosed last Fall in 07/2021. He is not on CPAP and he doesn't want one.  6. Diverticulosis Earl Hogan sees [name]. He still has occasional GI symptoms of upset stomach.   7. Mixed hyperlipidemia Earl Hogan last LDL was 56, HDL 32, and triglycerides 578.  8. Transaminitis Earl Hogan had a CT scan in 2019 with no showing of steatosis hasn't had a Hep C test.  9. Atrial fibrillation with rapid ventricular response (HCC) Earl Hogan is on Eliquis  and Flecainide, and he sees [name].  10. Other depression Earl Hogan is on Lexapro and has been for 15 years. He denies suicidal or homicidal ideations.  Assessment/Plan:   1. Other fatigue Earl Hogan does feel that his weight is causing his energy to be lower than it should be. Fatigue may be related to obesity, depression or many other causes. Labs will be ordered, and in the meanwhile, Earl Hogan will focus on self care including making healthy food choices, increasing physical activity and focusing on stress reduction.  - Vitamin B12 - Folate - T4, free - T3  2. SOBOE (shortness of breath on exertion) Earl Hogan does feel that he gets out of breath more easily that he used to when he exercises. [name]'s shortness of breath appears to be obesity related and exercise induced. He has agreed to work on weight loss and gradually increase exercise to treat his exercise induced shortness of breath. Will continue to monitor closely.  3. Pre-diabetes We will check labs today, and will follow up at [name] next office visit.  - Insulin, random  4. Vitamin D deficiency Earl Hogan will continue OTC Vitamin D 5,000 IU daily, and will follow-up for routine testing of Vitamin D, at least 2-3 times per year to avoid over-replacement.  5. OSA (obstructive sleep apnea) We will discuss sequence of untreated sleep apnea at [name] next appointment.  6. Diverticulosis We will follow up on symptoms at [name] next appointment.  7. Mixed hyperlipidemia Earl Hogan will continue his statin, with no change in dose.  8. Transaminitis We will check labs today to follow up on [name]'s LFTs.  - Comprehensive metabolic panel  9. Atrial fibrillation with rapid ventricular response (HCC) Earl Hogan will continue to follow up with Cardiology.  10. Other depression Earl Hogan is to discuss further management with [name].   11. Obesity with current BMI of 38.4 Earl Hogan is currently in the action stage of change and his goal is to continue with  weight loss efforts. I recommend Earl Hogan begin the structured treatment plan as follows:  He has agreed to the Category 4 Plan.  Exercise goals: No exercise has been prescribed at this time.   Behavioral modification strategies: increasing lean protein intake, meal planning and cooking strategies, keeping healthy foods in the home, and planning for success.  He was informed of the importance of frequent follow-up visits to maximize his success with intensive lifestyle modifications for his multiple health conditions. He was informed we would discuss his lab results at his next visit unless there is a critical issue that needs to be addressed sooner. Earl Hogan agreed to keep his next visit at the agreed upon time to discuss these results.  Objective:   Blood pressure 104/61, pulse 77, temperature 97.6 F (36.4 C), height 5\' 11"  (1.803 m), weight 275 lb (124.7 kg), SpO2 96 %. Body mass index is 38.35 kg/m.  EKG: Normal sinus rhythm, rate (unable to obtain).  Indirect Calorimeter completed today shows a VO2 of 336 and a REE of  2318.  His calculated basal metabolic rate is 4098 thus his basal metabolic rate is worse than expected.  General: Cooperative, alert, well developed, in no acute distress. HEENT: Conjunctivae and lids unremarkable. Cardiovascular: Regular rhythm.  Lungs: Normal work of breathing. Neurologic: No focal deficits.   Lab Results  Component Value Date   CREATININE 0.92 02/10/2022   BUN 11 02/10/2022   NA 142 02/10/2022   K 4.8 02/10/2022   CL 104 02/10/2022   CO2 22 02/10/2022   Lab Results  Component Value Date   ALT 48 (H) 02/10/2022   AST 28 02/10/2022   ALKPHOS 85 02/10/2022   BILITOT 0.7 02/10/2022   Lab Results  Component Value Date   HGBA1C 5.6 04/05/2021   Lab Results  Component Value Date   INSULIN 14.8 02/10/2022   Lab Results  Component Value Date   TSH 2.271 04/05/2021   No results found for: CHOL, HDL, LDLCALC, LDLDIRECT, TRIG, CHOLHDL Lab  Results  Component Value Date   WBC 6.7 01/27/2022   HGB 15.3 01/27/2022   HCT 45.6 01/27/2022   MCV 85 01/27/2022   PLT 322 01/27/2022   No results found for: IRON, TIBC, FERRITIN  Attestation Statements:   Reviewed by clinician on day of visit: allergies, medications, problem list, medical history, surgical history, family history, social history, and previous encounter notes.   I, Burt Knack, am acting as transcriptionist for Earl Likes, MD.  This is the patient's first visit at [facility]. The patient's NEW PATIENT PACKET was reviewed at length. Included in the packet: current and past health history, medications, allergies, ROS, gynecologic history (women only), surgical history, family history, social history, weight history, weight loss surgery history (for those that have had weight loss surgery), nutritional evaluation, mood and food questionnaire, PHQ9, Epworth questionnaire, sleep habits questionnaire, patient life and health improvement goals questionnaire. These will all be scanned into the patient's chart under media.   During the visit, I independently reviewed the patient's EKG, bioimpedance scale results, and indirect calorimeter results. I used this information to tailor a meal plan for the patient that will help him to lose weight and will improve his obesity-related conditions going forward. I performed a medically necessary appropriate examination and/or evaluation. I discussed the assessment and treatment plan with the patient. The patient was provided an opportunity to ask questions and all were answered. The patient agreed with the plan and demonstrated an understanding of the instructions. Labs were ordered at this visit and will be reviewed at the next visit unless more critical results need to be addressed immediately. Clinical information was updated and documented in the EMR.   Time spent on visit including pre-visit chart review and  post-visit care was 45 minutes I have reviewed the above documentation for accuracy and completeness, and I agree with the above. - Earl Likes, MD

## 2022-02-24 ENCOUNTER — Other Ambulatory Visit: Payer: Self-pay

## 2022-02-24 ENCOUNTER — Encounter (INDEPENDENT_AMBULATORY_CARE_PROVIDER_SITE_OTHER): Payer: Self-pay | Admitting: Family Medicine

## 2022-02-24 ENCOUNTER — Ambulatory Visit (INDEPENDENT_AMBULATORY_CARE_PROVIDER_SITE_OTHER): Payer: No Typology Code available for payment source | Admitting: Family Medicine

## 2022-02-24 VITALS — BP 103/60 | HR 65 | Temp 97.8°F | Ht 71.0 in | Wt 271.0 lb

## 2022-02-24 DIAGNOSIS — E669 Obesity, unspecified: Secondary | ICD-10-CM

## 2022-02-24 DIAGNOSIS — E7849 Other hyperlipidemia: Secondary | ICD-10-CM | POA: Diagnosis not present

## 2022-02-24 DIAGNOSIS — E538 Deficiency of other specified B group vitamins: Secondary | ICD-10-CM

## 2022-02-24 DIAGNOSIS — J3089 Other allergic rhinitis: Secondary | ICD-10-CM

## 2022-02-24 DIAGNOSIS — E559 Vitamin D deficiency, unspecified: Secondary | ICD-10-CM

## 2022-02-24 DIAGNOSIS — Z6837 Body mass index (BMI) 37.0-37.9, adult: Secondary | ICD-10-CM

## 2022-02-24 DIAGNOSIS — R7303 Prediabetes: Secondary | ICD-10-CM | POA: Diagnosis not present

## 2022-02-24 DIAGNOSIS — Z9189 Other specified personal risk factors, not elsewhere classified: Secondary | ICD-10-CM

## 2022-02-24 MED ORDER — ONE-A-DAY MENS PO TABS: 1.0000 | ORAL_TABLET | Freq: Every day | ORAL | 0 refills | Status: AC

## 2022-02-24 MED ORDER — VITAMIN D3 125 MCG (5000 UT) PO CAPS: 1.0000 | ORAL_CAPSULE | Freq: Every day | ORAL | 0 refills | Status: AC

## 2022-02-26 NOTE — Progress Notes (Signed)
? ? ? ?Chief Complaint:  ? ?OBESITY ?Earl Hogan is here to discuss his progress with his obesity treatment plan along with follow-up of his obesity related diagnoses. Darnelle is on the Category 4 Plan and states he is following his eating plan approximately 90% of the time. Limmie states he is walking and doing yard work 20/90 minutes 1 time per week. ? ?Today's visit was #: 2 ?Starting weight: 275 lbs ?Starting date: 02/10/2022 ?Today's weight: 271 lbs ?Today's date: 02/24/2022 ?Total lbs lost to date: 4 ?Total lbs lost since last in-office visit: 4 ? ?Interim History: Bernerd started plan last Monday (8 days ago). He doesn't think he is getting all his snacks in. He ate 3 cookies and had a beer over the weekend. Trenden is liking the challenge of the plan. He is trying to work indulgence in. He is going on anniversary trip to [place], [place]. He feels better overall. ? ?Subjective:  ? ?1. Vitamin D deficiency ?Nathanyl's vitamin D level is of 37.8. He is taking 5K IU/weekly daily. ? ?2. Prediabetes ?Lang's recent A1C was 6.0.  His insulin is at 14.8. He is on Mounjaro 2.5 mg. He denies GI side effects, he can get most food in. ? ?3. Other hyperlipidemia ?Perris's HLD is controlled on a statin. His LDL at 56, HLD at 32, Triglyc at 142. ? ?4. Vitamin B12 deficiency ?Taylen is not taking Multivitamins. He has no history of bariatric surgery or pernicious anemia. He notes fatigue. ? ?5. Allergic rhinitis due to other allergic trigger, unspecified seasonality ?Carmino has had an increase in allergy symptoms. He is on Zyrtec with good control. ? ?6. At risk of diabetes mellitus ?Pericles is at higher than average risk for developing diabetes due to obesity.  ? ?Assessment/Plan:  ? ?1. Vitamin D deficiency ?Ted agrees to increasing Vit D to 50K IU/weekly for 1 month with no refills.  ? ?- Start Cholecalciferol (VITAMIN D3) 125 MCG (5000 UT) CAPS; Take 1 capsule (5,000 Units total) by mouth daily.  Dispense: 100 capsule; Refill: 0 ? ?2. Prediabetes ?Maxfield is  to continue on [facility] and follow up with Dr. Barbera Setters. He is to follow up for labs in 3 months. ? ?3. Other hyperlipidemia ?Onix is to continue on statin with no change in dose. He is to follow up for labs in 3 months. ? ?4. Vitamin B12 deficiency ?Wessley is to start multivitamin and we will recheck B12 in 3 months. ? ?-Start multivitamin (ONE-A-DAY MEN'S) TABS tablet; Take 1 tablet by mouth daily.  Dispense: 100 tablet; Refill: 0 ? ?5. Allergic rhinitis due to other allergic trigger, unspecified seasonality ?Eilan is to continue on Zyrtec with no change in his treatment. He can start Flonase as well for symptom control. ? ?6. At risk of diabetes mellitus ?Gustaf was given approximately 15 minutes of diabetic education and counseling today. We discussed intensive lifestyle modifications today with an emphasis on weight loss as well as increasing exercise and decreasing simple carbohydrates in his diet. We also reviewed medication options with an emphasis on risk versus benefits of those discussed. ? ?Repetitive spaced learning was employed today to elicit superior memory formation and behavioral change. ? ?7. Obesity with current BMI of 37.8 ?Loxley is currently in the action stage of change. As such, his goal is to continue with weight loss efforts. He has agreed to the Category 4 Plan.  ? ?Exercise goals: All adults should avoid inactivity. Some physical activity is better than none, and adults who participate in  any amount of physical activity gain some health benefits. ? ?Behavioral modification strategies: increasing lean protein intake, meal planning and cooking strategies, keeping healthy foods in the home, and planning for success. ? ?Bernhardt has agreed to follow-up with our clinic in 3 weeks. He was informed of the importance of frequent follow-up visits to maximize his success with intensive lifestyle modifications for his multiple health conditions.  ? ?Objective:  ? ?Blood pressure 103/60, pulse 65, temperature  97.8 ?F (36.6 ?C), height 5\' 11"  (1.803 m), weight 271 lb (122.9 kg), SpO2 97 %. ?Body mass index is 37.8 kg/m?. ? ?General: Cooperative, alert, well developed, in no acute distress. ?HEENT: Conjunctivae and lids unremarkable. ?Cardiovascular: Regular rhythm.  ?Lungs: Normal work of breathing. ?Neurologic: No focal deficits.  ? ?Lab Results  ?Component Value Date  ? CREATININE 0.92 02/10/2022  ? BUN 11 02/10/2022  ? NA 142 02/10/2022  ? K 4.8 02/10/2022  ? CL 104 02/10/2022  ? CO2 22 02/10/2022  ? ?Lab Results  ?Component Value Date  ? ALT 48 (H) 02/10/2022  ? AST 28 02/10/2022  ? ALKPHOS 85 02/10/2022  ? BILITOT 0.7 02/10/2022  ? ?Lab Results  ?Component Value Date  ? HGBA1C 5.6 04/05/2021  ? ?Lab Results  ?Component Value Date  ? INSULIN 14.8 02/10/2022  ? ?Lab Results  ?Component Value Date  ? TSH 2.271 04/05/2021  ? ?No results found for: CHOL, HDL, LDLCALC, LDLDIRECT, TRIG, CHOLHDL ?No results found for: VD25OH ?Lab Results  ?Component Value Date  ? WBC 6.7 01/27/2022  ? HGB 15.3 01/27/2022  ? HCT 45.6 01/27/2022  ? MCV 85 01/27/2022  ? PLT 322 01/27/2022  ? ?No results found for: IRON, TIBC, FERRITIN ? ?Attestation Statements:  ? ?Reviewed by clinician on day of visit: allergies, medications, problem list, medical history, surgical history, family history, social history, and previous encounter notes. ? ?I, Brendell Tyus, am acting as transcriptionist for 01/29/2022, MD. ? ?I have reviewed the above documentation for accuracy and completeness, and I agree with the above. Reuben Likes, MD ? ?

## 2022-03-12 ENCOUNTER — Encounter (INDEPENDENT_AMBULATORY_CARE_PROVIDER_SITE_OTHER): Payer: Self-pay | Admitting: Family Medicine

## 2022-03-12 ENCOUNTER — Ambulatory Visit (INDEPENDENT_AMBULATORY_CARE_PROVIDER_SITE_OTHER): Payer: No Typology Code available for payment source | Admitting: Family Medicine

## 2022-03-12 VITALS — BP 101/65 | HR 72 | Temp 98.1°F | Ht 71.0 in | Wt 272.0 lb

## 2022-03-12 DIAGNOSIS — E669 Obesity, unspecified: Secondary | ICD-10-CM

## 2022-03-12 DIAGNOSIS — Z6838 Body mass index (BMI) 38.0-38.9, adult: Secondary | ICD-10-CM | POA: Diagnosis not present

## 2022-03-12 DIAGNOSIS — E559 Vitamin D deficiency, unspecified: Secondary | ICD-10-CM

## 2022-03-16 ENCOUNTER — Encounter (INDEPENDENT_AMBULATORY_CARE_PROVIDER_SITE_OTHER): Payer: Self-pay | Admitting: Family Medicine

## 2022-03-16 ENCOUNTER — Encounter (INDEPENDENT_AMBULATORY_CARE_PROVIDER_SITE_OTHER): Payer: Self-pay

## 2022-03-16 ENCOUNTER — Other Ambulatory Visit: Payer: Self-pay | Admitting: *Deleted

## 2022-03-16 MED ORDER — METOPROLOL SUCCINATE ER 50 MG PO TB24: 50.0000 mg | ORAL_TABLET | Freq: Every day | ORAL | 3 refills | Status: DC

## 2022-03-19 NOTE — Progress Notes (Signed)
? ? ? ?Chief Complaint:  ? ?OBESITY ?Earl Hogan is here to discuss his progress with his obesity treatment plan along with follow-up of his obesity related diagnoses. Earl Hogan is on the Category 4 Plan and states he is following his eating plan approximately 70-75% of the time. Earl Hogan states he is walking for 90 minutes 2 times per week. ? ?Today's visit was #: 3 ?Starting weight: 275 lbs ?Starting date: 02/10/2022 ?Today's weight: 272 lbs ?Today's date: 03/12/2022 ?Total lbs lost to date: 3 ?Total lbs lost since last in-office visit: 0 ? ?Interim History: Earl Hogan had a trip over the weekend and got more steps in. Tried to be mindful of food choices when away. Went to the store and restocked on the meal plan food. Today is his wife's birthday so he is bringing his wife to [employer]. Wondering about alternative fruit options. He is wondering about different fruits and oatmeal/grits.  ? ?Subjective:  ? ?1. Vitamin D deficiency ?Earl Hogan is on 5,000 IU OTC Vitamin D daily. He denies nausea, vomiting, or muscle weakness, but notes fatigue.  ? ?Assessment/Plan:  ? ?1. Vitamin D deficiency ?Earl Hogan will continue Vitamin D OTC. We will repeat labs in 3-4 months.  ? ?2. Obesity with current BMI of 38.0 ?Earl Hogan is currently in the action stage of change. As such, his goal is to continue with weight loss efforts. He has agreed to the Category 4 Plan. Other breakfast options handout was given. ? ?Exercise goals: As is.  ? ?Behavioral modification strategies: increasing lean protein intake, meal planning and cooking strategies, keeping healthy foods in the home, and planning for success. ? ?Earl Hogan has agreed to follow-up with our clinic in 3 weeks. He was informed of the importance of frequent follow-up visits to maximize his success with intensive lifestyle modifications for his multiple health conditions.  ? ?Objective:  ? ?Blood pressure 101/65, pulse 72, temperature 98.1 ?F (36.7 ?C), height 5\' 11"  (1.803 m), weight 272 lb (123.4 kg), SpO2 96  %. ?Body mass index is 37.94 kg/m?. ? ?General: Cooperative, alert, well developed, in no acute distress. ?HEENT: Conjunctivae and lids unremarkable. ?Cardiovascular: Regular rhythm.  ?Lungs: Normal work of breathing. ?Neurologic: No focal deficits.  ? ?Lab Results  ?Component Value Date  ? CREATININE 0.92 02/10/2022  ? BUN 11 02/10/2022  ? NA 142 02/10/2022  ? K 4.8 02/10/2022  ? CL 104 02/10/2022  ? CO2 22 02/10/2022  ? ?Lab Results  ?Component Value Date  ? ALT 48 (H) 02/10/2022  ? AST 28 02/10/2022  ? ALKPHOS 85 02/10/2022  ? BILITOT 0.7 02/10/2022  ? ?Lab Results  ?Component Value Date  ? HGBA1C 5.6 04/05/2021  ? ?Lab Results  ?Component Value Date  ? INSULIN 14.8 02/10/2022  ? ?Lab Results  ?Component Value Date  ? TSH 2.271 04/05/2021  ? ?No results found for: CHOL, HDL, LDLCALC, LDLDIRECT, TRIG, CHOLHDL ?No results found for: VD25OH ?Lab Results  ?Component Value Date  ? WBC 6.7 01/27/2022  ? HGB 15.3 01/27/2022  ? HCT 45.6 01/27/2022  ? MCV 85 01/27/2022  ? PLT 322 01/27/2022  ? ?No results found for: IRON, TIBC, FERRITIN ? ?Attestation Statements:  ? ?Reviewed by clinician on day of visit: allergies, medications, problem list, medical history, surgical history, family history, social history, and previous encounter notes. ? ? ?I, 01/29/2022, am acting as transcriptionist for Burt Knack, MD. ? ?I have reviewed the above documentation for accuracy and completeness, and I agree with the above. Reuben Likes, MD ? ? ?

## 2022-04-09 ENCOUNTER — Ambulatory Visit (INDEPENDENT_AMBULATORY_CARE_PROVIDER_SITE_OTHER): Payer: No Typology Code available for payment source | Admitting: Nurse Practitioner

## 2022-04-09 ENCOUNTER — Encounter (INDEPENDENT_AMBULATORY_CARE_PROVIDER_SITE_OTHER): Payer: Self-pay | Admitting: Family Medicine

## 2022-04-09 ENCOUNTER — Telehealth (INDEPENDENT_AMBULATORY_CARE_PROVIDER_SITE_OTHER): Payer: No Typology Code available for payment source | Admitting: Family Medicine

## 2022-04-09 DIAGNOSIS — R7303 Prediabetes: Secondary | ICD-10-CM | POA: Diagnosis not present

## 2022-04-09 DIAGNOSIS — Z6838 Body mass index (BMI) 38.0-38.9, adult: Secondary | ICD-10-CM

## 2022-04-09 DIAGNOSIS — E669 Obesity, unspecified: Secondary | ICD-10-CM | POA: Diagnosis not present

## 2022-04-16 NOTE — Progress Notes (Signed)
TeleHealth Visit:  Due to the COVID-19 pandemic, this visit was completed with telemedicine (audio/video) technology to reduce patient and provider exposure as well as to preserve personal protective equipment.   Earl Hogan has verbally consented to this TeleHealth visit. The patient is located at home, the provider is located at home. The participants in this visit include the listed provider and patient. The visit was conducted today via Mychart video.  Chief Complaint: OBESITY Earl Hogan is here to discuss his progress with his obesity treatment plan along with follow-up of his obesity related diagnoses. Earl Hogan is on the Category 4 Plan and states he is following his eating plan approximately 75-80% of the time. Earl Hogan states he is walking.  Today's visit was #: 4 Starting weight: 245 lbs Starting date: 02/10/2022  Interim History: Weight reported as 263 this am. Earl Hogan had a trip to [place] with wife to [place] type town. Earl Hogan feels like he has done exceptionally well on breakfast and lunch and dinner. He still is sometimes hit or miss. He recognizes he's within a pound or two of pre-pandemic weight. He recently got a puppy so he is walking more.  Subjective:   1. Prediabetes Polo's most recent A1c of 6.0, insulin 14.8. Now on Mounjaro 5 mg-having slight bit of constipation.  Assessment/Plan:   1. Prediabetes Earl Hogan will continue Mounjaro-can add Miralax daily and if needed Colace for 3-4 days after.  2. Obesity with current BMI of 38.0 Earl Hogan is currently in the action stage of change. As such, his goal is to continue with weight loss efforts. He has agreed to the Category 4 Plan.   Exercise goals: All adults should avoid inactivity. Some physical activity is better than none, and adults who participate in any amount of physical activity gain some health benefits.  Behavioral modification strategies: increasing lean protein intake, meal planning and cooking strategies, keeping healthy foods in  the home, and planning for success.  Earl Hogan has agreed to follow-up with our clinic in 4 weeks. He was informed of the importance of frequent follow-up visits to maximize his success with intensive lifestyle modifications for his multiple health conditions.  Objective:   VITALS: Per patient if applicable, see vitals. GENERAL: Alert and in no acute distress. CARDIOPULMONARY: No increased WOB. Speaking in clear sentences.  PSYCH: Pleasant and cooperative. Speech normal rate and rhythm. Affect is appropriate. Insight and judgement are appropriate. Attention is focused, linear, and appropriate.  NEURO: Oriented as arrived to appointment on time with no prompting.   Lab Results  Component Value Date   CREATININE 0.92 02/10/2022   BUN 11 02/10/2022   NA 142 02/10/2022   K 4.8 02/10/2022   CL 104 02/10/2022   CO2 22 02/10/2022   Lab Results  Component Value Date   ALT 48 (H) 02/10/2022   AST 28 02/10/2022   ALKPHOS 85 02/10/2022   BILITOT 0.7 02/10/2022   Lab Results  Component Value Date   HGBA1C 5.6 04/05/2021   Lab Results  Component Value Date   INSULIN 14.8 02/10/2022   Lab Results  Component Value Date   TSH 2.271 04/05/2021   No results found for: CHOL, HDL, LDLCALC, LDLDIRECT, TRIG, CHOLHDL No results found for: DQ22WL Lab Results  Component Value Date   WBC 6.7 01/27/2022   HGB 15.3 01/27/2022   HCT 45.6 01/27/2022   MCV 85 01/27/2022   PLT 322 01/27/2022   No results found for: IRON, TIBC, FERRITIN  Attestation Statements:   Reviewed by  clinician on day of visit: allergies, medications, problem list, medical history, surgical history, family history, social history, and previous encounter notes.  I, Elnora Morrison, RMA am acting as transcriptionist for Coralie Common, MD.  I have reviewed the above documentation for accuracy and completeness, and I agree with the above. - Coralie Common, MD

## 2022-05-04 ENCOUNTER — Encounter (INDEPENDENT_AMBULATORY_CARE_PROVIDER_SITE_OTHER): Payer: Self-pay | Admitting: Family Medicine

## 2022-05-04 ENCOUNTER — Ambulatory Visit (INDEPENDENT_AMBULATORY_CARE_PROVIDER_SITE_OTHER): Payer: No Typology Code available for payment source | Admitting: Family Medicine

## 2022-05-04 VITALS — BP 98/58 | HR 69 | Temp 97.7°F | Ht 71.0 in | Wt 260.0 lb

## 2022-05-04 DIAGNOSIS — E7849 Other hyperlipidemia: Secondary | ICD-10-CM

## 2022-05-04 DIAGNOSIS — Z6836 Body mass index (BMI) 36.0-36.9, adult: Secondary | ICD-10-CM

## 2022-05-04 DIAGNOSIS — E669 Obesity, unspecified: Secondary | ICD-10-CM | POA: Diagnosis not present

## 2022-05-07 NOTE — Progress Notes (Unsigned)
Chief Complaint:   OBESITY Earl Hogan is here to discuss his progress with his obesity treatment plan along with follow-up of his obesity related diagnoses. Mitsugi is on the Category 4 Plan and states he is following his eating plan approximately 75% of the time. Guido states he is walking 5-15 minutes 7 times per week.  Today's visit was #: 5 Starting weight: 275 lbs Starting date: 02/10/2022 Today's weight: 260 lbs Today's date: 05/04/2022 Total lbs lost to date: 15 lbs Total lbs lost since last in-office visit: 12  Interim History: [name] has had a good few weeks following meal plan fairly strictly. He has been eating out frequently but being mindful. Recently got a puppy so is anticipating increase in activity soon. Without hunger he is able to be much more mindful of choices and quantity.  Subjective:   1. Other hyperlipidemia Esther is currently taking Lipitor 20 mg. Without myalgias or transaminitis.  Assessment/Plan:   1. Other hyperlipidemia Tierra will continue Lipitor. His last FLP numbers within normal limits.  2. Obesity with current BMI of 36.3 Discussed nutrition at dinner and how to put food together incorporating food previously he had cut out. Aws is currently in the action stage of change. As such, his goal is to continue with weight loss efforts. He has agreed to the Category 4 Plan.   Exercise goals: All adults should avoid inactivity. Some physical activity is better than none, and adults who participate in any amount of physical activity gain some health benefits.  Behavioral modification strategies: increasing lean protein intake, meal planning and cooking strategies, keeping healthy foods in the home, and planning for success.  Chastin has agreed to follow-up with our clinic in 3 weeks. He was informed of the importance of frequent follow-up visits to maximize his success with intensive lifestyle modifications for his multiple health conditions.   Objective:   Blood  pressure (!) 98/58, pulse 69, temperature 97.7 F (36.5 C), height 5\' 11"  (1.803 m), weight 260 lb (117.9 kg), SpO2 97 %. Body mass index is 36.26 kg/m.  General: Cooperative, alert, well developed, in no acute distress. HEENT: Conjunctivae and lids unremarkable. Cardiovascular: Regular rhythm.  Lungs: Normal work of breathing. Neurologic: No focal deficits.   Lab Results  Component Value Date   CREATININE 0.92 02/10/2022   BUN 11 02/10/2022   NA 142 02/10/2022   K 4.8 02/10/2022   CL 104 02/10/2022   CO2 22 02/10/2022   Lab Results  Component Value Date   ALT 48 (H) 02/10/2022   AST 28 02/10/2022   ALKPHOS 85 02/10/2022   BILITOT 0.7 02/10/2022   Lab Results  Component Value Date   HGBA1C 5.6 04/05/2021   Lab Results  Component Value Date   INSULIN 14.8 02/10/2022   Lab Results  Component Value Date   TSH 2.271 04/05/2021   No results found for: "CHOL", "HDL", "LDLCALC", "LDLDIRECT", "TRIG", "CHOLHDL" No results found for: "VD25OH" Lab Results  Component Value Date   WBC 6.7 01/27/2022   HGB 15.3 01/27/2022   HCT 45.6 01/27/2022   MCV 85 01/27/2022   PLT 322 01/27/2022   No results found for: "IRON", "TIBC", "FERRITIN"  Attestation Statements:   Reviewed by clinician on day of visit: allergies, medications, problem list, medical history, surgical history, family history, social history, and previous encounter notes.  I, 01/29/2022, RMA am acting as transcriptionist for Fortino Sic, MD.  I have reviewed the above documentation for accuracy and completeness, and  I agree with the above. -  ***

## 2022-05-25 ENCOUNTER — Ambulatory Visit (INDEPENDENT_AMBULATORY_CARE_PROVIDER_SITE_OTHER): Payer: No Typology Code available for payment source | Admitting: Family Medicine

## 2022-05-25 ENCOUNTER — Encounter (INDEPENDENT_AMBULATORY_CARE_PROVIDER_SITE_OTHER): Payer: Self-pay | Admitting: Family Medicine

## 2022-05-25 VITALS — BP 110/72 | HR 87 | Temp 97.9°F | Ht 71.0 in | Wt 258.0 lb

## 2022-05-25 DIAGNOSIS — E669 Obesity, unspecified: Secondary | ICD-10-CM

## 2022-05-25 DIAGNOSIS — Z7984 Long term (current) use of oral hypoglycemic drugs: Secondary | ICD-10-CM

## 2022-05-25 DIAGNOSIS — Z6836 Body mass index (BMI) 36.0-36.9, adult: Secondary | ICD-10-CM | POA: Diagnosis not present

## 2022-05-25 DIAGNOSIS — R7303 Prediabetes: Secondary | ICD-10-CM

## 2022-05-26 ENCOUNTER — Encounter (INDEPENDENT_AMBULATORY_CARE_PROVIDER_SITE_OTHER): Payer: Self-pay | Admitting: Family Medicine

## 2022-05-26 ENCOUNTER — Encounter: Payer: Self-pay | Admitting: Cardiology

## 2022-05-26 ENCOUNTER — Other Ambulatory Visit: Payer: Self-pay | Admitting: Cardiology

## 2022-05-26 MED ORDER — FLECAINIDE ACETATE 150 MG PO TABS: 75.0000 mg | ORAL_TABLET | Freq: Two times a day (BID) | ORAL | 2 refills | Status: DC

## 2022-05-27 NOTE — Progress Notes (Signed)
Chief Complaint:   OBESITY Earl Hogan is here to discuss his progress with his obesity treatment plan along with follow-up of his obesity related diagnoses. Earl Hogan is on the Category 4 Plan and states he is following his eating plan approximately 75% of the time. Earl Hogan states he is walking his dog 10 minutes 3 times per week.  Today's visit was #: 6 Starting weight: 275 lbs Starting date: 02/10/2022 Today's weight: 258 lbs Today's date: 05/25/2022 Total lbs lost to date: 17 lbs Total lbs lost since last in-office visit: 2  Interim History: For the last few weeks Earl Hogan has been doing meal plan 4. He recognizes he is not eating as much as he needs to over last few weeks. He has been walking more with his puppy. Has not gotten in all snack calories. He's not hungry. Has eaten out more over last few weeks. He is going to [facility] got July 4th.  Subjective:   1. Prediabetes Earl Hogan last A1c was 6.0, insulin was 14.8. He is currently on Earl Hogan but he is not sure if this will be covered after June 30th.  Assessment/Plan:   1. Prediabetes Earl Hogan is to send a Mychart message if Earl Hogan not refilled to start Earl Hogan.  2. Obesity with current BMI of 36.0 Earl Hogan is currently in the action stage of change. As such, his goal is to continue with weight loss efforts. He has agreed to the Category 4 Plan.   Exercise goals: All adults should avoid inactivity. Some physical activity is better than none, and adults who participate in any amount of physical activity gain some health benefits.  Earl Hogan is to start making a structures specific plan for physical activity.  Behavioral modification strategies: increasing lean protein intake, meal planning and cooking strategies, keeping healthy foods in the home, and planning for success.  Earl Hogan has agreed to follow-up with our clinic in 4 weeks. He was informed of the importance of frequent follow-up visits to maximize his success with intensive lifestyle  modifications for his multiple health conditions.   Objective:   Blood pressure 110/72, pulse 87, temperature 97.9 F (36.6 C), height 5\' 11"  (1.803 m), weight 258 lb (117 kg), SpO2 98 %. Body mass index is 35.98 kg/m.  General: Cooperative, alert, well developed, in no acute distress. HEENT: Conjunctivae and lids unremarkable. Cardiovascular: Regular rhythm.  Lungs: Normal work of breathing. Neurologic: No focal deficits.   Lab Results  Component Value Date   CREATININE 0.92 02/10/2022   BUN 11 02/10/2022   NA 142 02/10/2022   K 4.8 02/10/2022   CL 104 02/10/2022   CO2 22 02/10/2022   Lab Results  Component Value Date   ALT 48 (H) 02/10/2022   AST 28 02/10/2022   ALKPHOS 85 02/10/2022   BILITOT 0.7 02/10/2022   Lab Results  Component Value Date   HGBA1C 5.6 04/05/2021   Lab Results  Component Value Date   INSULIN 14.8 02/10/2022   Lab Results  Component Value Date   TSH 2.271 04/05/2021   No results found for: "CHOL", "HDL", "LDLCALC", "LDLDIRECT", "TRIG", "CHOLHDL" No results found for: "VD25OH" Lab Results  Component Value Date   WBC 6.7 01/27/2022   HGB 15.3 01/27/2022   HCT 45.6 01/27/2022   MCV 85 01/27/2022   PLT 322 01/27/2022   No results found for: "IRON", "TIBC", "FERRITIN"  Attestation Statements:   Reviewed by clinician on day of visit: allergies, medications, problem list, medical history, surgical history, family history, social history,  and previous encounter notes.  I, Earl Hogan, RMA am acting as transcriptionist for Earl Common, MD.  I have reviewed the above documentation for accuracy and completeness, and I agree with the above. - Earl Common, MD

## 2022-06-08 NOTE — Telephone Encounter (Signed)
Please advise 

## 2022-06-22 ENCOUNTER — Ambulatory Visit (INDEPENDENT_AMBULATORY_CARE_PROVIDER_SITE_OTHER): Payer: No Typology Code available for payment source | Admitting: Family Medicine

## 2022-06-22 ENCOUNTER — Encounter (INDEPENDENT_AMBULATORY_CARE_PROVIDER_SITE_OTHER): Payer: Self-pay | Admitting: Family Medicine

## 2022-06-22 VITALS — BP 112/70 | HR 72 | Temp 98.1°F | Ht 71.0 in | Wt 256.0 lb

## 2022-06-22 DIAGNOSIS — R7401 Elevation of levels of liver transaminase levels: Secondary | ICD-10-CM | POA: Diagnosis not present

## 2022-06-22 DIAGNOSIS — Z6835 Body mass index (BMI) 35.0-35.9, adult: Secondary | ICD-10-CM | POA: Diagnosis not present

## 2022-06-22 DIAGNOSIS — E669 Obesity, unspecified: Secondary | ICD-10-CM

## 2022-06-24 NOTE — Progress Notes (Signed)
Chief Complaint:   OBESITY Earl Hogan is here to discuss his progress with his obesity treatment plan along with follow-up of his obesity related diagnoses. Earl Hogan is on the Category 4 Plan and states he is following his eating plan approximately 60% of the time. Earl Hogan states he is walking 4-5 times per week.  Today's visit was #: 7 Starting weight: 275 lbs  Starting date: 02/10/2022 Today's weight: 256 lbs Today's date: 06/22/2022 Total lbs lost to date: 7 lbs Total lbs lost since last in-office visit: 2  Interim History: Earl Hogan went on his 2 summer trips--[place] and [place]. He has been getting more walking in and may start Taekwon Do. He feels some discouragement with weight plateau. Ate 1-2 meals on plan with 1 indulgent meal while on vacation. Wants to stick more on plan and try to decrease eating out. He has started getting [employer] meals again.  Subjective:   1. Transaminitis Earl Hogan had recent CT in 2020 with no showing steatosis.   Assessment/Plan:   1. Transaminitis Will repeat labs in 1-2 months.   2. Obesity with current BMI of 35.8 Earl Hogan is currently in the action stage of change. As such, his goal is to continue with weight loss efforts. He has agreed to the Category 4 Plan.   Exercise goals: All adults should avoid inactivity. Some physical activity is better than none, and adults who participate in any amount of physical activity gain some health benefits.  Earl Hogan is encouraged to start [employer] and [employer].  Behavioral modification strategies: increasing lean protein intake, increasing vegetables, and meal planning and cooking strategies.  Earl Hogan has agreed to follow-up with our clinic in 4 weeks. He was informed of the importance of frequent follow-up visits to maximize his success with intensive lifestyle modifications for his multiple health conditions.   Objective:   Blood pressure 112/70, pulse 72, temperature 98.1 F (36.7 C), height 5\' 11"  (1.803  m), weight 256 lb (116.1 kg), SpO2 100 %. Body mass index is 35.7 kg/m.  General: Cooperative, alert, well developed, in no acute distress. HEENT: Conjunctivae and lids unremarkable. Cardiovascular: Regular rhythm.  Lungs: Normal work of breathing. Neurologic: No focal deficits.   Lab Results  Component Value Date   CREATININE 0.92 02/10/2022   BUN 11 02/10/2022   NA 142 02/10/2022   K 4.8 02/10/2022   CL 104 02/10/2022   CO2 22 02/10/2022   Lab Results  Component Value Date   ALT 48 (H) 02/10/2022   AST 28 02/10/2022   ALKPHOS 85 02/10/2022   BILITOT 0.7 02/10/2022   Lab Results  Component Value Date   HGBA1C 5.6 04/05/2021   Lab Results  Component Value Date   INSULIN 14.8 02/10/2022   Lab Results  Component Value Date   TSH 2.271 04/05/2021   No results found for: "CHOL", "HDL", "LDLCALC", "LDLDIRECT", "TRIG", "CHOLHDL" No results found for: "VD25OH" Lab Results  Component Value Date   WBC 6.7 01/27/2022   HGB 15.3 01/27/2022   HCT 45.6 01/27/2022   MCV 85 01/27/2022   PLT 322 01/27/2022   No results found for: "IRON", "TIBC", "FERRITIN"  Attestation Statements:   Reviewed by clinician on day of visit: allergies, medications, problem list, medical history, surgical history, family history, social history, and previous encounter notes.  I, 01/29/2022, RMA am acting as transcriptionist for Fortino Sic, MD.  I have reviewed the above documentation for accuracy and completeness, and I agree with the above. -  Coralie Common, MD

## 2022-07-08 ENCOUNTER — Encounter (INDEPENDENT_AMBULATORY_CARE_PROVIDER_SITE_OTHER): Payer: Self-pay

## 2022-07-21 ENCOUNTER — Ambulatory Visit (INDEPENDENT_AMBULATORY_CARE_PROVIDER_SITE_OTHER): Payer: No Typology Code available for payment source | Admitting: Family Medicine

## 2022-07-21 ENCOUNTER — Encounter (INDEPENDENT_AMBULATORY_CARE_PROVIDER_SITE_OTHER): Payer: Self-pay | Admitting: Family Medicine

## 2022-07-21 VITALS — BP 111/73 | HR 97 | Temp 97.8°F | Ht 71.0 in | Wt 253.0 lb

## 2022-07-21 DIAGNOSIS — R7401 Elevation of levels of liver transaminase levels: Secondary | ICD-10-CM

## 2022-07-21 DIAGNOSIS — R7303 Prediabetes: Secondary | ICD-10-CM | POA: Diagnosis not present

## 2022-07-21 DIAGNOSIS — E559 Vitamin D deficiency, unspecified: Secondary | ICD-10-CM | POA: Diagnosis not present

## 2022-07-21 DIAGNOSIS — E7849 Other hyperlipidemia: Secondary | ICD-10-CM | POA: Diagnosis not present

## 2022-07-21 DIAGNOSIS — E669 Obesity, unspecified: Secondary | ICD-10-CM

## 2022-07-21 DIAGNOSIS — Z6835 Body mass index (BMI) 35.0-35.9, adult: Secondary | ICD-10-CM

## 2022-07-24 ENCOUNTER — Encounter (INDEPENDENT_AMBULATORY_CARE_PROVIDER_SITE_OTHER): Payer: Self-pay | Admitting: Family Medicine

## 2022-07-27 LAB — COMPREHENSIVE METABOLIC PANEL
ALT: 18 IU/L (ref 0–44)
AST: 15 IU/L (ref 0–40)
Albumin/Globulin Ratio: 2 (ref 1.2–2.2)
Albumin: 4.5 g/dL (ref 3.9–4.9)
Alkaline Phosphatase: 86 IU/L (ref 44–121)
BUN/Creatinine Ratio: 24 (ref 10–24)
BUN: 24 mg/dL (ref 8–27)
Bilirubin Total: 0.7 mg/dL (ref 0.0–1.2)
CO2: 21 mmol/L (ref 20–29)
Calcium: 9.4 mg/dL (ref 8.6–10.2)
Chloride: 104 mmol/L (ref 96–106)
Creatinine, Ser: 0.98 mg/dL (ref 0.76–1.27)
Globulin, Total: 2.2 g/dL (ref 1.5–4.5)
Glucose: 99 mg/dL (ref 70–99)
Potassium: 5 mmol/L (ref 3.5–5.2)
Sodium: 138 mmol/L (ref 134–144)
Total Protein: 6.7 g/dL (ref 6.0–8.5)
eGFR: 87 mL/min/{1.73_m2} (ref >59)

## 2022-07-27 LAB — LIPID PANEL WITH LDL/HDL RATIO
Cholesterol, Total: 111 mg/dL (ref 100–199)
HDL: 33 mg/dL — ABNORMAL LOW (ref >39)
LDL Chol Calc (NIH): 55 mg/dL (ref 0–99)
LDL/HDL Ratio: 1.7 ratio (ref 0.0–3.6)
Triglycerides: 130 mg/dL (ref 0–149)
VLDL Cholesterol Cal: 23 mg/dL (ref 5–40)

## 2022-07-27 LAB — INSULIN, RANDOM: INSULIN: 16.9 u[IU]/mL (ref 2.6–24.9)

## 2022-07-27 LAB — HEMOGLOBIN A1C
Est. average glucose Bld gHb Est-mCnc: 114 mg/dL
Hgb A1c MFr Bld: 5.6 % (ref 4.8–5.6)

## 2022-07-27 LAB — VITAMIN D 25 HYDROXY (VIT D DEFICIENCY, FRACTURES): Vit D, 25-Hydroxy: 41.3 ng/mL (ref 30.0–100.0)

## 2022-07-27 NOTE — Telephone Encounter (Signed)
Please advise 

## 2022-07-28 ENCOUNTER — Other Ambulatory Visit: Payer: Self-pay | Admitting: Cardiology

## 2022-07-28 DIAGNOSIS — I4891 Unspecified atrial fibrillation: Secondary | ICD-10-CM

## 2022-07-28 DIAGNOSIS — I480 Paroxysmal atrial fibrillation: Secondary | ICD-10-CM

## 2022-07-28 NOTE — Telephone Encounter (Signed)
Eliquis 5mg  refill request received. Patient is 61 years old, weight-114.8kg, Crea-0.98 on 07/23/2022, Diagnosis-Afib, and last seen by 07/25/2022 on 01/27/2022. Dose is appropriate based on dosing criteria. Will send in refill to requested pharmacy.

## 2022-08-04 NOTE — Progress Notes (Signed)
Chief Complaint:   OBESITY Earl Hogan is here to discuss his progress with his obesity treatment plan along with follow-up of his obesity related diagnoses. Earl Hogan is on the Category 4 Plan and states he is following his eating plan approximately 75% of the time. Earl Hogan states he is 4-5,000 steps/Taekwon Do 1-4 times per week.  Today's visit was #: 8 Starting weight: 275 lbs Starting date: 02/10/2022 Today's weight: 253 lbs Today's date: 07/21/2022 Total lbs lost to date: 22 lbs Total lbs lost since last in-office visit: 3  Interim History: Earl Hogan has been helping to get grandson ready for semester abroad. He has been walking dog more and still enjoying TaeKwonDo. Felt hungry a few times which is unusual for him. Has been planning stay-cation for Labor Day. Following plan at 75%. He has tried to be more mindful of food choices when eating out. May want to cook a bit more in the next few weeks; getting back to meat and veggies for supper.  Subjective:   1. Transaminitis Earl Hogan's last ALT sightly elevated. Significant weight loss since that lab. No imaging showing steatosis.  2. Prediabetes Earl Hogan's last A1c was in April of 5.3. He is on Mounjaro 7.5 mg, some constipation but manageable.  3. Vitamin D deficiency Earl Hogan is on Vit D 50k IU a day. Denies any nausea, vomiting or muscle weakness. His prior Vit D level of 37.8.  4. Other hyperlipidemia Earl Hogan is on Lipitor 20 mg with no myalgias or transaminitis.  Assessment/Plan:   1. Transaminitis We will obtain labs today.  - Comprehensive metabolic panel  2. Prediabetes We will obtain labs today.  - Hemoglobin A1c - Insulin, random  3. Vitamin D deficiency We will obtain labs today.  - VITAMIN D 25 Hydroxy (Vit-D Deficiency, Fractures)  4. Other hyperlipidemia We will obtain labs today.  - Lipid Panel With LDL/HDL Ratio  5. Obesity with current BMI of 35.4 Earl Hogan is currently in the action stage of change. As such, his goal is to  continue with weight loss efforts. He has agreed to the Category 4 Plan.   Exercise goals: All adults should avoid inactivity. Some physical activity is better than none, and adults who participate in any amount of physical activity gain some health benefits.  Earl Hogan will add 10-15 minutes of resistance training 2 times a week.  Behavioral modification strategies: increasing lean protein intake, meal planning and cooking strategies, keeping healthy foods in the home, and planning for success.  Earl Hogan has agreed to follow-up with our clinic in 4 weeks. He was informed of the importance of frequent follow-up visits to maximize his success with intensive lifestyle modifications for his multiple health conditions.   Earl Hogan was informed we would discuss his lab results at his next visit unless there is a critical issue that needs to be addressed sooner. Earl Hogan agreed to keep his next visit at the agreed upon time to discuss these results.  Objective:   Blood pressure 111/73, pulse 97, temperature 97.8 F (36.6 C), height 5\' 11"  (1.803 m), weight 253 lb (114.8 kg), SpO2 98 %. Body mass index is 35.29 kg/m.  General: Cooperative, alert, well developed, in no acute distress. HEENT: Conjunctivae and lids unremarkable. Cardiovascular: Regular rhythm.  Lungs: Normal work of breathing. Neurologic: No focal deficits.   Lab Results  Component Value Date   CREATININE 0.98 07/23/2022   BUN 24 07/23/2022   NA 138 07/23/2022   K 5.0 07/23/2022   CL 104 07/23/2022  CO2 21 07/23/2022   Lab Results  Component Value Date   ALT 18 07/23/2022   AST 15 07/23/2022   ALKPHOS 86 07/23/2022   BILITOT 0.7 07/23/2022   Lab Results  Component Value Date   HGBA1C 5.6 07/23/2022   HGBA1C 5.6 04/05/2021   Lab Results  Component Value Date   INSULIN 16.9 07/23/2022   INSULIN 14.8 02/10/2022   Lab Results  Component Value Date   TSH 2.271 04/05/2021   Lab Results  Component Value Date   CHOL 111  07/23/2022   HDL 33 (L) 07/23/2022   LDLCALC 55 07/23/2022   TRIG 130 07/23/2022   Lab Results  Component Value Date   VD25OH 41.3 07/23/2022   Lab Results  Component Value Date   WBC 6.7 01/27/2022   HGB 15.3 01/27/2022   HCT 45.6 01/27/2022   MCV 85 01/27/2022   PLT 322 01/27/2022   No results found for: "IRON", "TIBC", "FERRITIN"  Attestation Statements:   Reviewed by clinician on day of visit: allergies, medications, problem list, medical history, surgical history, family history, social history, and previous encounter notes.  I, Fortino Sic, RMA am acting as transcriptionist for Reuben Likes, MD.  I have reviewed the above documentation for accuracy and completeness, and I agree with the above. - Reuben Likes, MD

## 2022-08-09 NOTE — Progress Notes (Unsigned)
Electrophysiology Office Follow up Visit Note:    Date:  08/09/2022   ID:  Earl Hogan, DOB 1961-06-13, MRN [id number]  PCP:  Mila Palmer, MD  [facility] Cardiologist:  Armanda Magic, MD  [facility] Electrophysiologist:  Lanier Prude, MD    Interval History:    Earl Hogan is a 61 y.o. male who presents for a follow up visit. They were last seen in clinic July 29, 2021 for paroxysmal atrial fibrillation and flutter.  He takes flecainide and metoprolol for rhythm control.       Past Medical History:  Diagnosis Date   Atrial fibrillation (HCC)    Back pain    Back pain    Bilateral swelling of feet    Chest pain    Constipation    Depression    Diverticulosis    Heartburn    History of kidney stones    Hx of seasonal allergies    Hypercholesteremia    Joint pain    Mixed hyperlipidemia 04/04/2021   OA (osteoarthritis)    Palpitations    Pre-diabetes    Sleep apnea    SOB (shortness of breath)    Umbilical hernia    Vitamin D deficiency    Wears glasses     Past Surgical History:  Procedure Laterality Date   CARDIAC CATHETERIZATION     prior to 2010   COLONOSCOPY     UMBILICAL HERNIA REPAIR N/A 05/17/2019   Procedure: LAPAROSCOPIC UMBILICAL HERNIA REPAIR WITH MESH;  Surgeon: Axel Filler, MD;  Location: [facility] OR;  Service: General;  Laterality: N/A;   WISDOM TOOTH EXTRACTION     WRIST SURGERY     left    Current Medications: No outpatient medications have been marked as taking for the 08/10/22 encounter (Appointment) with Lanier Prude, MD.     Allergies:   Penicillins   Social History   Socioeconomic History   Marital status: Married    Spouse name: Not on file   Number of children: Not on file   Years of education: Not on file   Highest education level: Not on file  Occupational History   Occupation: Community and transportation planner  Tobacco Use   Smoking status: Never   Smokeless tobacco: Never  Vaping Use    Vaping Use: Never used  Substance and Sexual Activity   Alcohol use: Yes    Comment: occasional   Drug use: No   Sexual activity: Not on file  Other Topics Concern   Not on file  Social History Narrative   Not on file   Social Determinants of Health   Financial Resource Strain: Not on file  Food Insecurity: Not on file  Transportation Needs: Not on file  Physical Activity: Not on file  Stress: Not on file  Social Connections: Not on file     Family History: The patient's family history includes Cancer in his father; Diabetes in his mother; Heart disease (age of onset: 22) in his mother; Hyperlipidemia in his mother; Hypertension in his mother; Obesity in his mother.  ROS:   Please see the history of present illness.    All other systems reviewed and are negative.  EKGs/Labs/Other Studies Reviewed:    The following studies were reviewed today:   EKG:  The ekg ordered today demonstrates ***  Recent Labs: 01/27/2022: Hemoglobin 15.3; Platelets 322 07/23/2022: ALT 18; BUN 24; Creatinine, Ser 0.98; Potassium 5.0; Sodium 138  Recent Lipid Panel    Component  Value Date/Time   CHOL 111 07/23/2022 0751   TRIG 130 07/23/2022 0751   HDL 33 (L) 07/23/2022 0751   LDLCALC 55 07/23/2022 0751    Physical Exam:    VS:  There were no vitals taken for this visit.    Wt Readings from Last 3 Encounters:  07/21/22 253 lb (114.8 kg)  06/22/22 256 lb (116.1 kg)  05/25/22 258 lb (117 kg)     GEN: *** Well nourished, well developed in no acute distress HEENT: Normal NECK: No JVD; No carotid bruits LYMPHATICS: No lymphadenopathy CARDIAC: ***RRR, no murmurs, rubs, gallops RESPIRATORY:  Clear to auscultation without rales, wheezing or rhonchi  ABDOMEN: Soft, non-tender, non-distended MUSCULOSKELETAL:  No edema; No deformity  SKIN: Warm and dry NEUROLOGIC:  Alert and oriented x 3 PSYCHIATRIC:  Normal affect        ASSESSMENT:    1. Atrial fibrillation with rapid ventricular  response (HCC)    PLAN:    In order of problems listed above:  #Paroxysmal atrial fibrillation flutter         Total time spent with patient today *** minutes. This includes reviewing records, evaluating the patient and coordinating care.   Medication Adjustments/Labs and Tests Ordered: Current medicines are reviewed at length with the patient today.  Concerns regarding medicines are outlined above.  No orders of the defined types were placed in this encounter.  No orders of the defined types were placed in this encounter.    Signed, Steffanie Dunn, MD, [facility], [facility] 08/09/2022 8:25 PM    Electrophysiology [facility]

## 2022-08-10 ENCOUNTER — Encounter: Payer: Self-pay | Admitting: Cardiology

## 2022-08-10 ENCOUNTER — Ambulatory Visit: Payer: No Typology Code available for payment source | Attending: Cardiology | Admitting: Cardiology

## 2022-08-10 VITALS — BP 112/72 | Ht 71.0 in | Wt 256.6 lb

## 2022-08-10 DIAGNOSIS — E6609 Other obesity due to excess calories: Secondary | ICD-10-CM | POA: Diagnosis not present

## 2022-08-10 DIAGNOSIS — Z79899 Other long term (current) drug therapy: Secondary | ICD-10-CM | POA: Diagnosis not present

## 2022-08-10 DIAGNOSIS — I480 Paroxysmal atrial fibrillation: Secondary | ICD-10-CM | POA: Diagnosis not present

## 2022-08-10 DIAGNOSIS — I4892 Unspecified atrial flutter: Secondary | ICD-10-CM | POA: Diagnosis not present

## 2022-08-10 DIAGNOSIS — Z6835 Body mass index (BMI) 35.0-35.9, adult: Secondary | ICD-10-CM

## 2022-08-10 NOTE — Progress Notes (Signed)
Electrophysiology Office Follow up Visit Note:    Date:  08/10/2022   ID:  Earl Hogan, DOB 16-Jan-1961, MRN [id number]  PCP:  Mila Palmer, MD  [facility] Cardiologist:  Armanda Magic, MD  [facility] Electrophysiologist:  Lanier Prude, MD    Interval History:    Earl Hogan is a 61 y.o. male who presents for a follow up visit. They were last seen in clinic July 29, 2021 for paroxysmal atrial fibrillation and flutter.  He takes flecainide and metoprolol for rhythm control.  He followed up with Maxine Glenn, PA-C on 01/27/2022 and was doing well. He reported one breakthrough episode in October felt to be due to stress. His EKG showed NSR at 65 bpm with stable intervals compared to prior.  Today, he reports successfully losing 30 lbs. He has been exercising with cardio and taekwondo. He also participates in [facility]. He is on [facility] as well.  Since his last visit he has had a few episodes of Afib, most recently Saturday night lasting overnight until he woke up in the morning. He confirms about 2 episodes in the last couple months.  During a typical episode of Afib he experiences chest tightness and fast heart beats, confirmed by his smart watch.  He also complains of dizziness. All day yesterday this occurred whenever he stood up. He has been unable to correlate this with his atrial fibrillation.  Generally he is compliant with his medications. Occasionally he misses some doses. In the past week he missed 2 doses of flecainide, since he takes them once in the morning and once at night.  He denies any shortness of breath, or peripheral edema. No headaches, syncope, orthopnea, or PND.      Past Medical History:  Diagnosis Date   Atrial fibrillation (HCC)    Back pain    Back pain    Bilateral swelling of feet    Chest pain    Constipation    Depression    Diverticulosis    Heartburn    History of kidney stones    Hx of seasonal  allergies    Hypercholesteremia    Joint pain    Mixed hyperlipidemia 04/04/2021   OA (osteoarthritis)    Palpitations    Pre-diabetes    Sleep apnea    SOB (shortness of breath)    Umbilical hernia    Vitamin D deficiency    Wears glasses     Past Surgical History:  Procedure Laterality Date   CARDIAC CATHETERIZATION     prior to 2010   COLONOSCOPY     UMBILICAL HERNIA REPAIR N/A 05/17/2019   Procedure: LAPAROSCOPIC UMBILICAL HERNIA REPAIR WITH MESH;  Surgeon: Axel Filler, MD;  Location: [facility] OR;  Service: General;  Laterality: N/A;   WISDOM TOOTH EXTRACTION     WRIST SURGERY     left    Current Medications: Current Meds  Medication Sig   acetaminophen (TYLENOL) 500 MG tablet Take 500 mg by mouth every 6 (six) hours as needed (back pain.).    atorvastatin (LIPITOR) 20 MG tablet Take 20 mg by mouth daily.   cetirizine (ZYRTEC) 10 MG tablet Take 10 mg by mouth at bedtime.   Cholecalciferol (VITAMIN D3) 125 MCG (5000 UT) CAPS Take 1 capsule (5,000 Units total) by mouth daily.   ELIQUIS 5 MG TABS tablet TAKE 1 TABLET(5 MG) BY MOUTH TWICE DAILY   escitalopram (LEXAPRO) 10 MG tablet Take 10 mg by mouth at bedtime.  flecainide (TAMBOCOR) 150 MG tablet Take 0.5 tablets (75 mg total) by mouth every 12 (twelve) hours.   meloxicam (MOBIC) 15 MG tablet Take 15 mg by mouth every other day.   metoprolol succinate (TOPROL-XL) 50 MG 24 hr tablet Take 1 tablet (50 mg total) by mouth daily. Take with or immediately following a meal.   multivitamin (ONE-A-DAY MEN'S) TABS tablet Take 1 tablet by mouth daily.   tadalafil (CIALIS) 20 MG tablet Take 20 mg by mouth daily as needed for erectile dysfunction.   Tamsulosin HCl (FLOMAX) 0.4 MG CAPS Take 1 capsule (0.4 mg total) by mouth daily after breakfast. (Patient taking differently: Take 0.4 mg by mouth at bedtime.)   tirzepatide (MOUNJARO) 7.5 MG/0.5ML Pen Inject 7.5 mg into the skin once a week.     Allergies:   Penicillins   Social  History   Socioeconomic History   Marital status: Married    Spouse name: Not on file   Number of children: Not on file   Years of education: Not on file   Highest education level: Not on file  Occupational History   Occupation: Restaurant manager, fast food  Tobacco Use   Smoking status: Never   Smokeless tobacco: Never  Vaping Use   Vaping Use: Never used  Substance and Sexual Activity   Alcohol use: Yes    Comment: occasional   Drug use: No   Sexual activity: Not on file  Other Topics Concern   Not on file  Social History Narrative   Not on file   Social Determinants of Health   Financial Resource Strain: Not on file  Food Insecurity: Not on file  Transportation Needs: Not on file  Physical Activity: Not on file  Stress: Not on file  Social Connections: Not on file     Family History: The patient's family history includes Cancer in his father; Diabetes in his mother; Heart disease (age of onset: 63) in his mother; Hyperlipidemia in his mother; Hypertension in his mother; Obesity in his mother.  ROS:   Please see the history of present illness.    (+) Dizziness All other systems reviewed and are negative.  EKGs/Labs/Other Studies Reviewed:    The following studies were reviewed today:  05/01/2021  Lexiscan Stress Test: Nuclear stress EF: 48%. The left ventricular ejection fraction is mildly decreased (45-54%). No T wave inversion was noted during stress. There was no ST segment deviation noted during stress. This is an intermediate risk study.   IMPRESSIONS Negative stress induced arrhythmias. No evidence of ischemia or infarction. Mild decrease in LVEF (48%).   RECOMMENDATIONS/CONCLUSIONS The study is consistent with an intermediate risk study (mild decrease in LVEF).  04/05/2021  Echocardiogram:  1. Left ventricular ejection fraction, by estimation, is 60 to 65%. The  left ventricle has normal function. The left ventricle has no regional  wall  motion abnormalities. Left ventricular diastolic function could not  be evaluated.   2. Right ventricular systolic function is normal. The right ventricular  size is normal. Tricuspid regurgitation signal is inadequate for assessing  PA pressure.   3. Left atrial size was mildly dilated.   4. Right atrial size was mildly dilated.   5. The mitral valve is normal in structure. No evidence of mitral valve  regurgitation. No evidence of mitral stenosis.   6. The aortic valve is normal in structure. Aortic valve regurgitation is  not visualized. No aortic stenosis is present.   7. The inferior vena cava is normal  in size with greater than 50%  respiratory variability, suggesting right atrial pressure of 3 mmHg.      EKG:  EKG is personally reviewed.  08/10/2022: Sinus rhythm.  PR interval 204 ms.  QRS duration 102 ms.  Recent Labs: 01/27/2022: Hemoglobin 15.3; Platelets 322 07/23/2022: ALT 18; BUN 24; Creatinine, Ser 0.98; Potassium 5.0; Sodium 138   Recent Lipid Panel    Component Value Date/Time   CHOL 111 07/23/2022 0751   TRIG 130 07/23/2022 0751   HDL 33 (L) 07/23/2022 0751   LDLCALC 55 07/23/2022 0751    Physical Exam:    VS:  BP 112/72   Ht 5\' 11"  (1.803 m)   Wt 256 lb 9.6 oz (116.4 kg)   SpO2 94%   BMI 35.79 kg/m     Wt Readings from Last 3 Encounters:  08/10/22 256 lb 9.6 oz (116.4 kg)  07/21/22 253 lb (114.8 kg)  06/22/22 256 lb (116.1 kg)     GEN: Well nourished, well developed in no acute distress.  Obese. HEENT: Normal NECK: No JVD; No carotid bruits LYMPHATICS: No lymphadenopathy CARDIAC: RRR, no murmurs, rubs, gallops.  Distant heart sounds. RESPIRATORY:  Clear to auscultation without rales, wheezing or rhonchi  ABDOMEN: Soft, non-tender, non-distended MUSCULOSKELETAL:  No edema; No deformity  SKIN: Warm and dry NEUROLOGIC:  Alert and oriented x 3 PSYCHIATRIC:  Normal affect        ASSESSMENT:    1. Paroxysmal atrial fibrillation (HCC)   2.  Atrial flutter, unspecified type (HCC)   3. Encounter for long-term (current) use of high-risk medication   4. Class 2 obesity due to excess calories with body mass index (BMI) of 35.0 to 35.9 in adult, unspecified whether serious comorbidity present    PLAN:    In order of problems listed above:  #Paroxysmal atrial fibrillation flutter Low burden on flecainide and metoprolol.  He does continue to have some breakthrough.  I discussed continued antiarrhythmic drug therapy versus catheter ablation.  He would like to continue on the antiarrhythmic drug therapy and will let 06/24/22 know if the frequency of A-fib episodes changes/increases.  He would be a good candidate for catheter ablation if he chooses to pursue this in the future.  For now, continue Eliquis for stroke prophylaxis.  #Obesity Has lost a significant mount of weight using the [facility] weight loss clinic in [place].  Follow-up in 6 months with APP.     Medication Adjustments/Labs and Tests Ordered: Current medicines are reviewed at length with the patient today.  Concerns regarding medicines are outlined above.   Orders Placed This Encounter  Procedures   EKG 12-Lead   No orders of the defined types were placed in this encounter.  I,Mathew Stumpf,acting as a Live oak for Neurosurgeon, MD.,have documented all relevant documentation on the behalf of Lanier Prude, MD,as directed by  Lanier Prude, MD while in the presence of Lanier Prude, MD.  I, Lanier Prude, MD, have reviewed all documentation for this visit. The documentation on 08/10/22 for the exam, diagnosis, procedures, and orders are all accurate and complete.   Signed, 10/10/22, MD, [facility], [facility] 08/10/2022 8:28 AM    Electrophysiology [facility]

## 2022-08-10 NOTE — Patient Instructions (Signed)
Medication Instructions:  none *If you need a refill on your cardiac medications before your next appointment, please call your pharmacy*   Lab Work: none If you have labs (blood work) drawn today and your tests are completely normal, you will receive your results only by: MyChart Message (if you have MyChart) OR A paper copy in the mail If you have any lab test that is abnormal or we need to change your treatment, we will call you to review the results.   Testing/Procedures: none   Follow-Up: At  HeartCare, you and your health needs are our priority.  As part of our continuing mission to provide you with exceptional heart care, we have created designated Provider Care Teams.  These Care Teams include your primary Cardiologist (physician) and Advanced Practice Providers (APPs -  Physician Assistants and Nurse Practitioners) who all work together to provide you with the care you need, when you need it.  We recommend signing up for the patient portal called "MyChart".  Sign up information is provided on this After Visit Summary.  MyChart is used to connect with patients for Virtual Visits (Telemedicine).  Patients are able to view lab/test results, encounter notes, upcoming appointments, etc.  Non-urgent messages can be sent to your provider as well.   To learn more about what you can do with MyChart, go to [email or web address].    Your next appointment:   6 month(s)  The format for your next appointment:   In Person  Provider:   You will see one of the following Advanced Practice Providers on your designated Care Team:   Renee Ursuy, PA-C Michael "Andy" Tillery, PA-C      Other Instructions none  Important Information About Sugar       

## 2022-08-19 ENCOUNTER — Ambulatory Visit (INDEPENDENT_AMBULATORY_CARE_PROVIDER_SITE_OTHER): Payer: No Typology Code available for payment source | Admitting: Family Medicine

## 2022-08-19 ENCOUNTER — Encounter (INDEPENDENT_AMBULATORY_CARE_PROVIDER_SITE_OTHER): Payer: Self-pay | Admitting: Family Medicine

## 2022-08-19 VITALS — BP 115/73 | HR 68 | Temp 97.9°F | Ht 71.0 in | Wt 251.0 lb

## 2022-08-19 DIAGNOSIS — E559 Vitamin D deficiency, unspecified: Secondary | ICD-10-CM

## 2022-08-19 DIAGNOSIS — I4891 Unspecified atrial fibrillation: Secondary | ICD-10-CM | POA: Diagnosis not present

## 2022-08-19 DIAGNOSIS — E669 Obesity, unspecified: Secondary | ICD-10-CM | POA: Diagnosis not present

## 2022-08-19 DIAGNOSIS — Z6835 Body mass index (BMI) 35.0-35.9, adult: Secondary | ICD-10-CM | POA: Diagnosis not present

## 2022-08-24 NOTE — Telephone Encounter (Signed)
Please review

## 2022-08-24 NOTE — Progress Notes (Unsigned)
Chief Complaint:   OBESITY Earl Hogan is here to discuss his progress with his obesity treatment plan along with follow-up of his obesity related diagnoses. Earl Hogan is on the Category 4 Plan and states he is following his eating plan approximately 75% of the time. Yassine states he is walking/Taikwon  60 minutes 1-4 times per week.  Today's visit was #: 9 Starting weight: 275 lbs Starting date: 02/10/2022 Today's weight: 251 lbs Today's date: 08/19/2022 Total lbs lost to date: 24 lbs Total lbs lost since last in-office visit: 2  Interim History: Earl Hogan feels good on meal plan and with activity, he is gotten in. Plans to go to [place] football games. Tries to be mindful of choices and quantity. Thinks protein when eating out is the hardest. Sometimes he is not always getting 10 oz protein at supper.  Subjective:   1. Atrial fibrillation, unspecified type ([place]) Bonifacio had recent episode 5 days ago- 10-12 hrs long. Saw cardiology 2 days ago. Patient on Flecainide, Toprol+Eliquis. Per recent cardio appointment- low burden.  2. Vitamin D deficiency Earl Hogan is currently taking over the counter Vit D. Denies any nausea, vomiting or muscle weakness.  Assessment/Plan:   1. Atrial fibrillation, unspecified type ([place]) Continue antiarrhythmic medication.  2. Vitamin D deficiency Continue over the counter Vit D.  3. Obesity with current BMI of 35.1 Gre[place]ry is currently in the action stage of change. As such, his goal is to continue with weight loss efforts. He has agreed to the Category 4 Plan.   Exercise goals: All adults should avoid inactivity. Some physical activity is better than none, and adults who participate in any amount of physical activity gain some health benefits.  Behavioral modification strategies: increasing lean protein intake, meal planning and cooking strategies, keeping healthy foods in the home, and planning for success.  Earl Hogan has agreed to follow-up with our clinic in 3 weeks. He was  informed of the importance of frequent follow-up visits to maximize his success with intensive lifestyle modifications for his multiple health conditions.   Objective:   Blood pressure 115/73, pulse 68, temperature 97.9 F (36.6 C), height 5\' 11"  (1.803 m), weight 251 lb (113.9 kg), SpO2 99 %. Body mass index is 35.01 kg/m.  General: Cooperative, alert, well developed, in no acute distress. HEENT: Conjunctivae and lids unremarkable. Cardiovascular: Regular rhythm.  Lungs: Normal work of breathing. Neurologic: No focal deficits.   Lab Results  Component Value Date   CREATININE 0.98 07/23/2022   BUN 24 07/23/2022   NA 138 07/23/2022   K 5.0 07/23/2022   CL 104 07/23/2022   CO2 21 07/23/2022   Lab Results  Component Value Date   ALT 18 07/23/2022   AST 15 07/23/2022   ALKPHOS 86 07/23/2022   BILITOT 0.7 07/23/2022   Lab Results  Component Value Date   HGBA1C 5.6 07/23/2022   HGBA1C 5.6 04/05/2021   Lab Results  Component Value Date   INSULIN 16.9 07/23/2022   INSULIN 14.8 02/10/2022   Lab Results  Component Value Date   TSH 2.271 04/05/2021   Lab Results  Component Value Date   CHOL 111 07/23/2022   HDL 33 (L) 07/23/2022   LDLCALC 55 07/23/2022   TRIG 130 07/23/2022   Lab Results  Component Value Date   VD25OH 41.3 07/23/2022   Lab Results  Component Value Date   WBC 6.7 01/27/2022   HGB 15.3 01/27/2022   HCT 45.6 01/27/2022   MCV 85 01/27/2022   PLT  322 01/27/2022   No results found for: "IRON", "TIBC", "FERRITIN"  Attestation Statements:   Reviewed by clinician on day of visit: allergies, medications, problem list, medical history, surgical history, family history, social history, and previous encounter notes.  I, Fortino Sic, RMA am acting as transcriptionist for Reuben Likes, MD. I have reviewed the above documentation for accuracy and completeness, and I agree with the above. - Reuben Likes, MD

## 2022-09-17 ENCOUNTER — Ambulatory Visit (INDEPENDENT_AMBULATORY_CARE_PROVIDER_SITE_OTHER): Payer: No Typology Code available for payment source | Admitting: Family Medicine

## 2022-09-17 ENCOUNTER — Encounter (INDEPENDENT_AMBULATORY_CARE_PROVIDER_SITE_OTHER): Payer: Self-pay | Admitting: Family Medicine

## 2022-09-17 VITALS — BP 103/66 | HR 63 | Temp 98.3°F | Ht 71.0 in | Wt 247.0 lb

## 2022-09-17 DIAGNOSIS — Z6834 Body mass index (BMI) 34.0-34.9, adult: Secondary | ICD-10-CM | POA: Diagnosis not present

## 2022-09-17 DIAGNOSIS — E7849 Other hyperlipidemia: Secondary | ICD-10-CM

## 2022-09-17 DIAGNOSIS — E669 Obesity, unspecified: Secondary | ICD-10-CM | POA: Diagnosis not present

## 2022-09-17 DIAGNOSIS — Z6838 Body mass index (BMI) 38.0-38.9, adult: Secondary | ICD-10-CM

## 2022-09-17 DIAGNOSIS — R7303 Prediabetes: Secondary | ICD-10-CM | POA: Diagnosis not present

## 2022-09-24 NOTE — Progress Notes (Signed)
Chief Complaint:   OBESITY Earl Hogan is here to discuss his progress with his obesity treatment plan along with follow-up of his obesity related diagnoses. Earl Hogan is on the Category 4 Plan and states he is following his eating plan approximately 75% of the time. 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  Today's visit was #: 10 Starting weight: 275 lbs Starting date: 02/10/2022 Today's weight: 247 lbs Today's date: 09/17/2022 Total lbs lost to date: 28 lbs Total lbs lost since last in-office visit: 4  Interim History: Earl Hogan has done well with weight loss. Does well with breakfast and lunch--less so with dinner--but really hungry. Tolerating Mounjaro well. No nausea or vomiting. Continues to enjoy Taekwon Do--made yellow belt.  Subjective:   1. Prediabetes A1c 5.6, Insulin 16.9. Earl Hogan is taking Mounjaro 7.5 mg, but insurance coverage is about to run out. Some notes constipation.  2. Other hyperlipidemia Earl Hogan is taking Lipitor 20 mg daily. LDL/Trigly--at goal. HDL 33  Assessment/Plan:   1. Prediabetes Continue taking Mounjaro. Continue with Cat 4 plan and will get labs rechecked early next year.  2. Other hyperlipidemia Continue taking Lipitor and continue with Cat 4 meal plan.   3. Obesity with current BMI of 34.6 Earl Hogan is currently in the action stage of change. As such, his goal is to continue with weight loss efforts. He has agreed to the Category 4 Plan.   Exercise goals: As is.  Behavioral modification strategies: increasing lean protein intake and meal planning and cooking strategies.  Earl Hogan has agreed to follow-up with our clinic in 5 weeks. He was informed of the importance of frequent follow-up visits to maximize his success with intensive lifestyle modifications for his multiple health conditions.   Objective:   Blood pressure 103/66, pulse 63, temperature 98.3 F (36.8 C), height 5\' 11"  (1.803 m), weight 247 lb (112 kg), SpO2  96 %. Body mass index is 34.45 kg/m.  General: Cooperative, alert, well developed, in no acute distress. HEENT: Conjunctivae and lids unremarkable. Cardiovascular: Regular rhythm.  Lungs: Normal work of breathing. Neurologic: No focal deficits.   Lab Results  Component Value Date   CREATININE 0.98 07/23/2022   BUN 24 07/23/2022   NA 138 07/23/2022   K 5.0 07/23/2022   CL 104 07/23/2022   CO2 21 07/23/2022   Lab Results  Component Value Date   ALT 18 07/23/2022   AST 15 07/23/2022   ALKPHOS 86 07/23/2022   BILITOT 0.7 07/23/2022   Lab Results  Component Value Date   HGBA1C 5.6 07/23/2022   HGBA1C 5.6 04/05/2021   Lab Results  Component Value Date   INSULIN 16.9 07/23/2022   INSULIN 14.8 02/10/2022   Lab Results  Component Value Date   TSH 2.271 04/05/2021   Lab Results  Component Value Date   CHOL 111 07/23/2022   HDL 33 (L) 07/23/2022   LDLCALC 55 07/23/2022   TRIG 130 07/23/2022   Lab Results  Component Value Date   VD25OH 41.3 07/23/2022   Lab Results  Component Value Date   WBC 6.7 01/27/2022   HGB 15.3 01/27/2022   HCT 45.6 01/27/2022   MCV 85 01/27/2022   PLT 322 01/27/2022   No results found for: "IRON", "TIBC", "FERRITIN"  Attestation Statements:   Reviewed by clinician on day of visit: allergies, medications, problem list, medical history, surgical history, family history, social history, and previous encounter notes.  I, Earl Hogan, RMA am acting  as transcriptionist for Earl Common, MD.  I have reviewed the above documentation for accuracy and completeness, and I agree with the above. Earl Common, MD'

## 2022-10-20 ENCOUNTER — Ambulatory Visit (INDEPENDENT_AMBULATORY_CARE_PROVIDER_SITE_OTHER): Payer: No Typology Code available for payment source | Admitting: Family Medicine

## 2022-10-20 VITALS — BP 110/68 | HR 69 | Temp 98.5°F | Ht 71.0 in | Wt 246.0 lb

## 2022-10-20 DIAGNOSIS — Z6834 Body mass index (BMI) 34.0-34.9, adult: Secondary | ICD-10-CM | POA: Diagnosis not present

## 2022-10-20 DIAGNOSIS — R7303 Prediabetes: Secondary | ICD-10-CM | POA: Diagnosis not present

## 2022-10-20 DIAGNOSIS — E669 Obesity, unspecified: Secondary | ICD-10-CM | POA: Diagnosis not present

## 2022-10-20 MED ORDER — SAXENDA 18 MG/3ML ~~LOC~~ SOPN: 3.0000 mg | PEN_INJECTOR | Freq: Every day | SUBCUTANEOUS | 0 refills | Status: DC

## 2022-10-20 MED ORDER — BD PEN NEEDLE NANO 2ND GEN 32G X 4 MM MISC: 1.0000 | Freq: Two times a day (BID) | 0 refills | Status: DC

## 2022-10-21 ENCOUNTER — Encounter (INDEPENDENT_AMBULATORY_CARE_PROVIDER_SITE_OTHER): Payer: Self-pay | Admitting: Family Medicine

## 2022-10-29 ENCOUNTER — Encounter (INDEPENDENT_AMBULATORY_CARE_PROVIDER_SITE_OTHER): Payer: Self-pay | Admitting: Family Medicine

## 2022-10-29 ENCOUNTER — Telehealth (INDEPENDENT_AMBULATORY_CARE_PROVIDER_SITE_OTHER): Payer: Self-pay

## 2022-10-29 NOTE — Telephone Encounter (Signed)
Prior auth submitted thru Rx benefits.  PA#: [id number] For Saxenda.

## 2022-11-03 NOTE — Progress Notes (Signed)
Chief Complaint:   OBESITY Foxx is here to discuss his progress with his obesity treatment plan along with follow-up of his obesity related diagnoses. Wally is on the Category 4 Plan and states he is following his eating plan approximately 70% of the time. Dashawn states he is taking Taekwon Do and walking 60-120 minutes 2-3 times per week.  Today's visit was #: 11 Starting weight: 275 lbs Starting date: 02/10/2022 Today's weight: 246 lbs Today's date: 10/20/2022 Total lbs lost to date: 29 lbs Total lbs lost since last in-office visit: 1  Interim History: Yordin has been doing okay in terms of 20.  He has gotten calories but has struggled with protein intake.  He is going to stay local and low-key for Thanksgiving.  He has spaced out Mounjaro to every 10 days.  He is looking into options for AOM all covered by insurance.  Subjective:   1. Prediabetes Rachit is on his last patient on Mounjaro 7.5 mg.  No GI side effects noted.  Assessment/Plan:   1. Prediabetes Tiron will follow-up with insurance drug coverage for drugs that are applicable to above disease.  2. Obesity with current BMI of 34.3 START Saxenda 3 mg SubQ daily for 1 month with 0 refills.  -Start Liraglutide -Weight Management (SAXENDA) 18 MG/3ML SOPN; Inject 3 mg into the skin daily.  Dispense: 45 mL; Refill: 0  -Refill Insulin Pen Needle (BD PEN NEEDLE NANO 2ND GEN) 32G X 4 MM MISC; 1 Package by Does not apply route 2 (two) times daily.  Dispense: 100 each; Refill: 0  Weslee is currently in the action stage of change. As such, his goal is to continue with weight loss efforts. He has agreed to the Category 4 Plan.   Exercise goals: All adults should avoid inactivity. Some physical activity is better than none, and adults who participate in any amount of physical activity gain some health benefits.  Behavioral modification strategies: increasing lean protein intake, meal planning and cooking strategies, keeping healthy  foods in the home, and planning for success.  Keen has agreed to follow-up with our clinic in 6 weeks. He was informed of the importance of frequent follow-up visits to maximize his success with intensive lifestyle modifications for his multiple health conditions.   Objective:   Blood pressure 110/68, pulse 69, temperature 98.5 F (36.9 C), height 5\' 11"  (1.803 m), weight 246 lb (111.6 kg), SpO2 98 %. Body mass index is 34.31 kg/m.  General: Cooperative, alert, well developed, in no acute distress. HEENT: Conjunctivae and lids unremarkable. Cardiovascular: Regular rhythm.  Lungs: Normal work of breathing. Neurologic: No focal deficits.   Lab Results  Component Value Date   CREATININE 0.98 07/23/2022   BUN 24 07/23/2022   NA 138 07/23/2022   K 5.0 07/23/2022   CL 104 07/23/2022   CO2 21 07/23/2022   Lab Results  Component Value Date   ALT 18 07/23/2022   AST 15 07/23/2022   ALKPHOS 86 07/23/2022   BILITOT 0.7 07/23/2022   Lab Results  Component Value Date   HGBA1C 5.6 07/23/2022   HGBA1C 5.6 04/05/2021   Lab Results  Component Value Date   INSULIN 16.9 07/23/2022   INSULIN 14.8 02/10/2022   Lab Results  Component Value Date   TSH 2.271 04/05/2021   Lab Results  Component Value Date   CHOL 111 07/23/2022   HDL 33 (L) 07/23/2022   LDLCALC 55 07/23/2022   TRIG 130 07/23/2022   Lab Results  Component Value Date   VD25OH 41.3 07/23/2022   Lab Results  Component Value Date   WBC 6.7 01/27/2022   HGB 15.3 01/27/2022   HCT 45.6 01/27/2022   MCV 85 01/27/2022   PLT 322 01/27/2022   No results found for: "IRON", "TIBC", "FERRITIN"  Attestation Statements:   Reviewed by clinician on day of visit: allergies, medications, problem list, medical history, surgical history, family history, social history, and previous encounter notes.  I, Fortino Sic, RMA am acting as transcriptionist for Reuben Likes, MD.  I have reviewed the above documentation for  accuracy and completeness, and I agree with the above. - Reuben Likes, MD

## 2022-12-02 ENCOUNTER — Ambulatory Visit (INDEPENDENT_AMBULATORY_CARE_PROVIDER_SITE_OTHER): Payer: No Typology Code available for payment source | Admitting: Family Medicine

## 2022-12-02 ENCOUNTER — Encounter (INDEPENDENT_AMBULATORY_CARE_PROVIDER_SITE_OTHER): Payer: Self-pay | Admitting: Family Medicine

## 2022-12-02 VITALS — BP 108/66 | HR 72 | Temp 97.9°F | Ht 71.0 in | Wt 246.0 lb

## 2022-12-02 DIAGNOSIS — R7401 Elevation of levels of liver transaminase levels: Secondary | ICD-10-CM | POA: Diagnosis not present

## 2022-12-02 DIAGNOSIS — E669 Obesity, unspecified: Secondary | ICD-10-CM | POA: Diagnosis not present

## 2022-12-02 DIAGNOSIS — Z6834 Body mass index (BMI) 34.0-34.9, adult: Secondary | ICD-10-CM | POA: Diagnosis not present

## 2022-12-14 NOTE — Progress Notes (Signed)
Chief Complaint:   OBESITY Earl Hogan is here to discuss his progress with his obesity treatment plan along with follow-up of his obesity related diagnoses. Earl Hogan is on the Category 4 Plan and states he is following his eating plan approximately 40% of the time. 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  Today's visit was #: 12 Starting weight: 275 lbs Starting date: 02/10/2022 Today's weight: 246 lbs Today's date: 12/02/2022 Total lbs lost to date: 29 lbs Total lbs lost since last in-office visit: 0  Interim History: Earl Hogan had a great holiday season.  Still with some nausea with Saxenda (up to 3.0 mg).  Not experiencing hunger with Saxenda.  Does report that he did eat somewhat differently over the holiday season.  Subjective:   1. Transaminitis Last ALT-18, AST 15, Alk Phos of 86. Prior labs were higher.  Assessment/Plan:   1. Transaminitis Labs with PCP at annual exam.  2. Obesity with current BMI of 34.4 Earl Hogan is currently in the action stage of change. As such, his goal is to continue with weight loss efforts. He has agreed to the Category 4 Plan.   Exercise goals: All adults should avoid inactivity. Some physical activity is better than none, and adults who participate in any amount of physical activity gain some health benefits.  Behavioral modification strategies: increasing lean protein intake, meal planning and cooking strategies, keeping healthy foods in the home, and planning for success.  Earl Hogan has agreed to follow-up with our clinic in 4 weeks. He was informed of the importance of frequent follow-up visits to maximize his success with intensive lifestyle modifications for his multiple health conditions.   Objective:   Blood pressure 108/66, pulse 72, temperature 97.9 F (36.6 C), height 5\' 11"  (1.803 m), weight 246 lb (111.6 kg), SpO2 97 %. Body mass index is 34.31 kg/m.  General: Cooperative, alert, well developed, in no  acute distress. HEENT: Conjunctivae and lids unremarkable. Cardiovascular: Regular rhythm.  Lungs: Normal work of breathing. Neurologic: No focal deficits.   Lab Results  Component Value Date   CREATININE 0.98 07/23/2022   BUN 24 07/23/2022   NA 138 07/23/2022   K 5.0 07/23/2022   CL 104 07/23/2022   CO2 21 07/23/2022   Lab Results  Component Value Date   ALT 18 07/23/2022   AST 15 07/23/2022   ALKPHOS 86 07/23/2022   BILITOT 0.7 07/23/2022   Lab Results  Component Value Date   HGBA1C 5.6 07/23/2022   HGBA1C 5.6 04/05/2021   Lab Results  Component Value Date   INSULIN 16.9 07/23/2022   INSULIN 14.8 02/10/2022   Lab Results  Component Value Date   TSH 2.271 04/05/2021   Lab Results  Component Value Date   CHOL 111 07/23/2022   HDL 33 (L) 07/23/2022   LDLCALC 55 07/23/2022   TRIG 130 07/23/2022   Lab Results  Component Value Date   VD25OH 41.3 07/23/2022   Lab Results  Component Value Date   WBC 6.7 01/27/2022   HGB 15.3 01/27/2022   HCT 45.6 01/27/2022   MCV 85 01/27/2022   PLT 322 01/27/2022   No results found for: "IRON", "TIBC", "FERRITIN"  Attestation Statements:   Reviewed by clinician on day of visit: allergies, medications, problem list, medical history, surgical history, family history, social history, and previous encounter notes.  I, Elnora Morrison, RMA am acting as transcriptionist for Coralie Common, MD.  I have reviewed the above  documentation for accuracy and completeness, and I agree with the above. - Coralie Common, MD

## 2022-12-30 ENCOUNTER — Ambulatory Visit (INDEPENDENT_AMBULATORY_CARE_PROVIDER_SITE_OTHER): Payer: No Typology Code available for payment source | Admitting: Family Medicine

## 2022-12-30 ENCOUNTER — Encounter (INDEPENDENT_AMBULATORY_CARE_PROVIDER_SITE_OTHER): Payer: Self-pay | Admitting: Family Medicine

## 2022-12-30 VITALS — BP 107/70 | HR 73 | Temp 98.2°F | Ht 71.0 in | Wt 241.0 lb

## 2022-12-30 DIAGNOSIS — E7849 Other hyperlipidemia: Secondary | ICD-10-CM

## 2022-12-30 DIAGNOSIS — Z6830 Body mass index (BMI) 30.0-30.9, adult: Secondary | ICD-10-CM

## 2022-12-30 DIAGNOSIS — E669 Obesity, unspecified: Secondary | ICD-10-CM

## 2022-12-30 DIAGNOSIS — R7303 Prediabetes: Secondary | ICD-10-CM

## 2023-01-11 NOTE — Progress Notes (Signed)
Chief Complaint:   OBESITY Earl Hogan is here to discuss his progress with his obesity treatment plan along with follow-up of his obesity related diagnoses. Earl Hogan is on the Category 4 Plan and states he is following his eating plan approximately 65-70% of the time. Earl Hogan states he is doing [employer] Do 60 minutes 2 times per week.  Today's visit was #: 7 Starting weight: 275 lbs Starting date: 02/10/2022 Today's weight: 241 lbs Today's date: 12/30/2022 Total lbs lost to date: 34 lbs Total lbs lost since last in-office visit: 5  Interim History: Earl Hogan voices he is very satisfied with labs done at PCP.  He feels very satisfied with where he is in terms of weight and activity is.  Realizes his activity level- has been less with less walking.  Feels more of a challenge with weight loss but feels he is on the cusp of this being a maintable  lifestyle.  Does not necessarily feels Earl Hogan is as effective in hunger control on Mounjaro.  Currently on 2.4 mg Saxenda.  Subjective:   1. Prediabetes A1c 5.5 on GLP-1.  Occasional nausea.  2. Other hyperlipidemia HDL of 36, Trig of 120, LDL of 78.  On Lipitor.  Assessment/Plan:   1. Prediabetes Continue current GLP-1: Continue Cat 4 without change in plan.  2. Other hyperlipidemia Continue Lipitor without change in medication or dose.  3. BMI 30.0-30.9,adult  4. Obesity with current BMI of 38.4 Earl Hogan is currently in the action stage of change. As such, his goal is to continue with weight loss efforts. He has agreed to the Category 4 Plan.   Exercise goals: As is.  Encouraged to increase amount of activity to min of 150-300 min, moderate/vigorous activity.  Behavioral modification strategies: increasing lean protein intake, meal planning and cooking strategies, keeping healthy foods in the home, and planning for success.  Earl Hogan has agreed to follow-up with our clinic in 6 weeks. He was informed of the importance of frequent follow-up visits to  maximize his success with intensive lifestyle modifications for his multiple health conditions.   Objective:   Blood pressure 107/70, pulse 73, temperature 98.2 F (36.8 C), height '5\' 11"'$  (1.803 m), weight 241 lb (109.3 kg), SpO2 98 %. Body mass index is 33.61 kg/m.  General: Cooperative, alert, well developed, in no acute distress. HEENT: Conjunctivae and lids unremarkable. Cardiovascular: Regular rhythm.  Lungs: Normal work of breathing. Neurologic: No focal deficits.   Lab Results  Component Value Date   CREATININE 0.98 07/23/2022   BUN 24 07/23/2022   NA 138 07/23/2022   K 5.0 07/23/2022   CL 104 07/23/2022   CO2 21 07/23/2022   Lab Results  Component Value Date   ALT 18 07/23/2022   AST 15 07/23/2022   ALKPHOS 86 07/23/2022   BILITOT 0.7 07/23/2022   Lab Results  Component Value Date   HGBA1C 5.6 07/23/2022   HGBA1C 5.6 04/05/2021   Lab Results  Component Value Date   INSULIN 16.9 07/23/2022   INSULIN 14.8 02/10/2022   Lab Results  Component Value Date   TSH 2.271 04/05/2021   Lab Results  Component Value Date   CHOL 111 07/23/2022   HDL 33 (L) 07/23/2022   LDLCALC 55 07/23/2022   TRIG 130 07/23/2022   Lab Results  Component Value Date   VD25OH 41.3 07/23/2022   Lab Results  Component Value Date   WBC 6.7 01/27/2022   HGB 15.3 01/27/2022   HCT 45.6 01/27/2022  MCV 85 01/27/2022   PLT 322 01/27/2022   No results found for: "IRON", "TIBC", "FERRITIN"  Attestation Statements:   Reviewed by clinician on day of visit: allergies, medications, problem list, medical history, surgical history, family history, social history, and previous encounter notes.  I, Elnora Morrison, RMA am acting as transcriptionist for Coralie Common, MD.  I have reviewed the above documentation for accuracy and completeness, and I agree with the above. - Coralie Common, MD

## 2023-01-15 ENCOUNTER — Other Ambulatory Visit (INDEPENDENT_AMBULATORY_CARE_PROVIDER_SITE_OTHER): Payer: Self-pay | Admitting: Family Medicine

## 2023-01-15 DIAGNOSIS — Z6838 Body mass index (BMI) 38.0-38.9, adult: Secondary | ICD-10-CM

## 2023-02-05 ENCOUNTER — Encounter (INDEPENDENT_AMBULATORY_CARE_PROVIDER_SITE_OTHER): Payer: Self-pay | Admitting: Family Medicine

## 2023-02-05 ENCOUNTER — Other Ambulatory Visit (INDEPENDENT_AMBULATORY_CARE_PROVIDER_SITE_OTHER): Payer: Self-pay | Admitting: Family Medicine

## 2023-02-08 ENCOUNTER — Other Ambulatory Visit: Payer: Self-pay | Admitting: Cardiology

## 2023-02-08 DIAGNOSIS — I480 Paroxysmal atrial fibrillation: Secondary | ICD-10-CM

## 2023-02-08 NOTE — Telephone Encounter (Signed)
Message sent to Dr Jeani Sow to address tomorrow

## 2023-02-08 NOTE — Telephone Encounter (Signed)
Prescription refill request for Eliquis received. Indication: Afib  Last office visit: 08/10/22 Quentin Ore)  Scr: 0.98 (07/23/22)  Age: 62 Weight: 109.3kg  Appropriate dose. Refill sent.

## 2023-02-08 NOTE — Telephone Encounter (Signed)
Please advise 

## 2023-02-08 NOTE — Telephone Encounter (Signed)
Disregard refill, pt appt 02/09/22

## 2023-02-10 ENCOUNTER — Ambulatory Visit (INDEPENDENT_AMBULATORY_CARE_PROVIDER_SITE_OTHER): Payer: No Typology Code available for payment source | Admitting: Family Medicine

## 2023-02-10 ENCOUNTER — Encounter (INDEPENDENT_AMBULATORY_CARE_PROVIDER_SITE_OTHER): Payer: Self-pay | Admitting: Family Medicine

## 2023-02-10 VITALS — BP 107/68 | HR 69 | Temp 98.1°F | Ht 71.0 in | Wt 241.0 lb

## 2023-02-10 DIAGNOSIS — Z6833 Body mass index (BMI) 33.0-33.9, adult: Secondary | ICD-10-CM | POA: Diagnosis not present

## 2023-02-10 DIAGNOSIS — E559 Vitamin D deficiency, unspecified: Secondary | ICD-10-CM | POA: Diagnosis not present

## 2023-02-10 DIAGNOSIS — E669 Obesity, unspecified: Secondary | ICD-10-CM | POA: Diagnosis not present

## 2023-02-10 MED ORDER — SAXENDA 18 MG/3ML ~~LOC~~ SOPN: 3.0000 mg | PEN_INJECTOR | Freq: Every day | SUBCUTANEOUS | 0 refills | Status: DC

## 2023-02-10 NOTE — Progress Notes (Deleted)
Patient voices its been a stressful few weeks as his wife is an Personnel officer in [employer].  He is prepping for his trip. Still following breakfast and lunch really well.  Not sure if it is [place] or life challenges but he has felt more snack craving.  [place]. He feels ready to start more activity in the upcoming few weeks. Recognizes he would need to increase compliance to 75-80% on plan and increase activity in order get closer to goal of 225.

## 2023-02-11 ENCOUNTER — Other Ambulatory Visit (INDEPENDENT_AMBULATORY_CARE_PROVIDER_SITE_OTHER): Payer: Self-pay | Admitting: Family Medicine

## 2023-02-17 NOTE — Progress Notes (Signed)
Chief Complaint:   OBESITY Earl Hogan is here to discuss his progress with his obesity treatment plan along with follow-up of his obesity related diagnoses. Earl Hogan is on the Category 4 Plan and states he is following his eating plan approximately 65-70% of the time. Earl Hogan states he is taekwondo 60 minutes 2 times per week.  Today's visit was #: 79 Starting weight: 275 LBS Starting date: 02/10/2022 Today's weight: 241 LBS Today's date: 02/10/2023 Total lbs lost to date: 34 LBS Total lbs lost since last in-office visit: 0  Interim History:  Patient voices its been a stressful few weeks as his wife is an Personnel officer in [employer].  He is prepping for his trip. Still following breakfast and lunch really well.  Not sure if it is [place] or life challenges but he has felt more snack craving.  [place]. He feels ready to start more activity in the upcoming few weeks. Recognizes he would need to increase compliance to 75-80% on plan and increase activity in order get closer to goal of 225.     Subjective:   1. Vitamin D deficiency Patient is taking OTC vitamin D.  Patient denies nausea, vomiting, muscle weakness but is  Assessment/Plan:   1. Vitamin D deficiency Continue OTC vitamin D 5000 IU.  2. BMI 33.0-33.9,adult  3. Obesity with starting BMI of 38.4 Earl Hogan is currently in the action stage of change. As such, his goal is to continue with weight loss efforts. He has agreed to the Category 4 Plan.   Exercise goals: All adults should avoid inactivity. Some physical activity is better than none, and adults who participate in any amount of physical activity gain some health benefits.  Patient encouraged to add in resistance training weekly.  Behavioral modification strategies: increasing lean protein intake, meal planning and cooking strategies, keeping healthy foods in the home, and planning for success.  Earl Hogan has agreed to follow-up  with our clinic in 5 weeks. He was informed of the importance of frequent follow-up visits to maximize his success with intensive lifestyle modifications for his multiple health conditions.   Objective:   Blood pressure 107/68, pulse 69, temperature 98.1 F (36.7 C), height 5\' 11"  (1.803 m), weight 241 lb (109.3 kg), SpO2 96 %. Body mass index is 33.61 kg/m.  General: Cooperative, alert, well developed, in no acute distress. HEENT: Conjunctivae and lids unremarkable. Cardiovascular: Regular rhythm.  Lungs: Normal work of breathing. Neurologic: No focal deficits.   Lab Results  Component Value Date   CREATININE 0.98 07/23/2022   BUN 24 07/23/2022   NA 138 07/23/2022   K 5.0 07/23/2022   CL 104 07/23/2022   CO2 21 07/23/2022   Lab Results  Component Value Date   ALT 18 07/23/2022   AST 15 07/23/2022   ALKPHOS 86 07/23/2022   BILITOT 0.7 07/23/2022   Lab Results  Component Value Date   HGBA1C 5.6 07/23/2022   HGBA1C 5.6 04/05/2021   Lab Results  Component Value Date   INSULIN 16.9 07/23/2022   INSULIN 14.8 02/10/2022   Lab Results  Component Value Date   TSH 2.271 04/05/2021   Lab Results  Component Value Date   CHOL 111 07/23/2022   HDL 33 (L) 07/23/2022   LDLCALC 55 07/23/2022   TRIG 130 07/23/2022   Lab Results  Component Value Date   VD25OH 41.3 07/23/2022   Lab Results  Component Value Date   WBC  6.7 01/27/2022   HGB 15.3 01/27/2022   HCT 45.6 01/27/2022   MCV 85 01/27/2022   PLT 322 01/27/2022   No results found for: "IRON", "TIBC", "FERRITIN"  Attestation Statements:   Reviewed by clinician on day of visit: allergies, medications, problem list, medical history, surgical history, family history, social history, and previous encounter notes.  I, Davy Pique, RMA, am acting as transcriptionist for Coralie Common, MD.  I have reviewed the above documentation for accuracy and completeness, and I agree with the above. - Coralie Common,  MD

## 2023-02-22 ENCOUNTER — Encounter: Payer: Self-pay | Admitting: Cardiology

## 2023-02-22 ENCOUNTER — Other Ambulatory Visit: Payer: Self-pay | Admitting: Cardiology

## 2023-02-23 ENCOUNTER — Other Ambulatory Visit: Payer: Self-pay

## 2023-02-23 MED ORDER — METOPROLOL SUCCINATE ER 50 MG PO TB24: 50.0000 mg | ORAL_TABLET | Freq: Every day | ORAL | 1 refills | Status: DC

## 2023-02-23 MED ORDER — FLECAINIDE ACETATE 150 MG PO TABS: 75.0000 mg | ORAL_TABLET | Freq: Two times a day (BID) | ORAL | 1 refills | Status: DC

## 2023-02-24 NOTE — Telephone Encounter (Signed)
Please advise 

## 2023-03-22 ENCOUNTER — Encounter (INDEPENDENT_AMBULATORY_CARE_PROVIDER_SITE_OTHER): Payer: Self-pay | Admitting: Family Medicine

## 2023-03-22 ENCOUNTER — Telehealth (INDEPENDENT_AMBULATORY_CARE_PROVIDER_SITE_OTHER): Payer: No Typology Code available for payment source | Admitting: Family Medicine

## 2023-03-22 DIAGNOSIS — E669 Obesity, unspecified: Secondary | ICD-10-CM | POA: Diagnosis not present

## 2023-03-22 DIAGNOSIS — Z6833 Body mass index (BMI) 33.0-33.9, adult: Secondary | ICD-10-CM | POA: Diagnosis not present

## 2023-03-22 DIAGNOSIS — R7303 Prediabetes: Secondary | ICD-10-CM | POA: Diagnosis not present

## 2023-03-23 NOTE — Progress Notes (Signed)
TeleHealth Visit:  Due to the COVID-19 pandemic, this visit was completed with telemedicine (audio/video) technology to reduce patient and provider exposure as well as to preserve personal protective equipment.   Earl Hogan has verbally consented to this TeleHealth visit. The patient is located at home, the provider is located at the [facility] office. The participants in this visit include the listed provider and patient. The visit was conducted today via video visit.  Chief Complaint: OBESITY Earl Hogan is here to discuss his progress with his obesity treatment plan along with follow-up of his obesity related diagnoses. Earl Hogan is on the Category 4 Plan and states he is following his eating plan approximately 25% of the time. Earl Hogan states he is walking 6-12,000 steps for the last 14 days.  Today's visit was #: 15 Starting weight: 275 lbs Starting date: 02/10/2022  Interim History: Patient is having GI issues currently and has had symptoms for 1 day of diarrhea and lower abdominal pain.  Patient went to [place] since last appointment, did a lot of walking and was able to maintain his weight while away.  Patient did notice some weight fluctuations with travel.  In the next few weeks patient has not been really planned, staying local.  Subjective:   1. Prediabetes Patient is on Saxenda, restarted at 3 mg daily after a few weeks without it due to a supply shortage.  Assessment/Plan:   1. Prediabetes Continue GLP-1, revisit treatment as medications become more available and cost decreases.  2. BMI 33.0-33.9,adult  3. Obesity with starting BMI of 38.4 Earl Hogan is currently in the action stage of change. As such, his goal is to continue with weight loss efforts. He has agreed to the Category 4 Plan.   Exercise goals: All adults should avoid inactivity. Some physical activity is better than none, and adults who participate in any amount of physical activity gain some health  benefits.  Behavioral modification strategies: increasing lean protein intake, meal planning and cooking strategies, keeping healthy foods in the home, and planning for success.  Earl Hogan has agreed to follow-up with our clinic in 4 weeks. He was informed of the importance of frequent follow-up visits to maximize his success with intensive lifestyle modifications for his multiple health conditions.  Objective:   VITALS: Per patient if applicable, see vitals. GENERAL: Alert and in no acute distress. CARDIOPULMONARY: No increased WOB. Speaking in clear sentences.  PSYCH: Pleasant and cooperative. Speech normal rate and rhythm. Affect is appropriate. Insight and judgement are appropriate. Attention is focused, linear, and appropriate.  NEURO: Oriented as arrived to appointment on time with no prompting.   Lab Results  Component Value Date   CREATININE 0.98 07/23/2022   BUN 24 07/23/2022   NA 138 07/23/2022   K 5.0 07/23/2022   CL 104 07/23/2022   CO2 21 07/23/2022   Lab Results  Component Value Date   ALT 18 07/23/2022   AST 15 07/23/2022   ALKPHOS 86 07/23/2022   BILITOT 0.7 07/23/2022   Lab Results  Component Value Date   HGBA1C 5.6 07/23/2022   HGBA1C 5.6 04/05/2021   Lab Results  Component Value Date   INSULIN 16.9 07/23/2022   INSULIN 14.8 02/10/2022   Lab Results  Component Value Date   TSH 2.271 04/05/2021   Lab Results  Component Value Date   CHOL 111 07/23/2022   HDL 33 (L) 07/23/2022   LDLCALC 55 07/23/2022   TRIG 130 07/23/2022   Lab Results  Component Value  Date   VD25OH 41.3 07/23/2022   Lab Results  Component Value Date   WBC 6.7 01/27/2022   HGB 15.3 01/27/2022   HCT 45.6 01/27/2022   MCV 85 01/27/2022   PLT 322 01/27/2022   No results found for: "IRON", "TIBC", "FERRITIN"  Attestation Statements:   Reviewed by clinician on day of visit: allergies, medications, problem list, medical history, surgical history, family history, social history,  and previous encounter notes.  I, Malcolm Metro, RMA, am acting as transcriptionist for Reuben Likes, MD. I have reviewed the above documentation for accuracy and completeness, and I agree with the above. - Reuben Likes, MD

## 2023-04-04 NOTE — Progress Notes (Unsigned)
Cardiology Office Note Date:  04/08/2023  Patient ID:  Earl Hogan, Earl Hogan Jun 10, 1961, MRN [id number] PCP:  Mila Palmer, MD  Cardiologist: Dr. Mayford Knife Electrophysiologist: [name]    Chief Complaint:  6 mo  History of Present Illness: Earl Hogan is a 62 y.o. male with history of AFib, AFlutter.  Saw [name] 08/10/22, had lost 30 lbs. Exercising with cardio and taekwondo. He also participates in [facility]. On [employer] as well.  Reported a couple episodes of Afib in the last couple months, associated with some chest tihtness, also reported some orthostatic dizziness, this not particularly linked to his AF Fairly low burden, felt a good candidate for ablation, [pt preferred to continue the same medical management at that time.  TODAY He is doing very well Went on a 2 week [place] vacation recently , a lot of walking, still doing his taekwondo with good exertional capacity, no CP Has indigestion rarely, meal related and TUMS helps it, no CP otherwise No palpitations, has not felt./had Afib since Oct 2023. No near syncope or syncope. No SOB, DOE No bleeding or signs of bleeding   AFib/AFlutter/AAD hx Diagnosed may 2022 Flecainide started May 2022   Past Medical History:  Diagnosis Date   Atrial fibrillation (HCC)    Back pain    Back pain    Bilateral swelling of feet    Chest pain    Constipation    Depression    Diverticulosis    Heartburn    History of kidney stones    Hx of seasonal allergies    Hypercholesteremia    Joint pain    Mixed hyperlipidemia 04/04/2021   OA (osteoarthritis)    Palpitations    Pre-diabetes    Sleep apnea    SOB (shortness of breath)    Umbilical hernia    Vitamin D deficiency    Wears glasses     Past Surgical History:  Procedure Laterality Date   CARDIAC CATHETERIZATION     prior to 2010   COLONOSCOPY     UMBILICAL HERNIA REPAIR N/A 05/17/2019   Procedure: LAPAROSCOPIC UMBILICAL HERNIA REPAIR  WITH MESH;  Surgeon: Axel Filler, MD;  Location: [facility] OR;  Service: General;  Laterality: N/A;   WISDOM TOOTH EXTRACTION     WRIST SURGERY     left    Current Outpatient Medications  Medication Sig Dispense Refill   acetaminophen (TYLENOL) 500 MG tablet Take 500 mg by mouth every 6 (six) hours as needed (back pain.).      atorvastatin (LIPITOR) 20 MG tablet Take 20 mg by mouth daily.     BD PEN NEEDLE NANO U/F 32G X 4 MM MISC USE TWICE DAILY 100 each 0   cetirizine (ZYRTEC) 10 MG tablet Take 10 mg by mouth at bedtime.     Cholecalciferol (VITAMIN D3) 125 MCG (5000 UT) CAPS Take 1 capsule (5,000 Units total) by mouth daily. 100 capsule 0   ELIQUIS 5 MG TABS tablet TAKE 1 TABLET(5 MG) BY MOUTH TWICE DAILY 60 tablet 5   escitalopram (LEXAPRO) 10 MG tablet Take 10 mg by mouth at bedtime.      flecainide (TAMBOCOR) 150 MG tablet Take 0.5 tablets (75 mg total) by mouth every 12 (twelve) hours. 90 tablet 1   metoprolol succinate (TOPROL-XL) 50 MG 24 hr tablet Take 1 tablet (50 mg total) by mouth daily. Take with or immediately following a meal. 90 tablet 1   multivitamin (ONE-A-DAY MEN'S)  TABS tablet Take 1 tablet by mouth daily. 100 tablet 0   SAXENDA 18 MG/3ML SOPN INJECT SUBCUTANEOUSLY 3MG  DAILY 45 mL 0   tadalafil (CIALIS) 20 MG tablet Take 20 mg by mouth daily as needed for erectile dysfunction.     Tamsulosin HCl (FLOMAX) 0.4 MG CAPS Take 1 capsule (0.4 mg total) by mouth daily after breakfast. (Patient taking differently: Take 0.4 mg by mouth in the morning and at bedtime.) 10 capsule 0   TIZANIDINE HCL PO Take by mouth.     meloxicam (MOBIC) 15 MG tablet Take 15 mg by mouth every other day. (Patient not taking: Reported on 04/08/2023)     No current facility-administered medications for this visit.    Allergies:   Penicillins   Social History:  The patient  reports that he has never smoked. He has never used smokeless tobacco. He reports current alcohol use. He reports that he does not  use drugs.   Family History:  The patient's family history includes Cancer in his father; Diabetes in his mother; Heart disease (age of onset: 45) in his mother; Hyperlipidemia in his mother; Hypertension in his mother; Obesity in his mother.  ROS:  Please see the history of present illness.    All other systems are reviewed and otherwise negative.   PHYSICAL EXAM:  VS:  BP 100/68 (BP Location: Left Arm, Patient Position: Sitting, Cuff Size: Normal)   Pulse 77   Ht 5\' 11"  (1.803 m)   Wt 244 lb 6.4 oz (110.9 kg)   SpO2 96%   BMI 34.09 kg/m  BMI: Body mass index is 34.09 kg/m. Well nourished, well developed, in no acute distress HEENT: normocephalic, atraumatic Neck: no JVD, carotid bruits or masses Cardiac:   RRR; no significant murmurs, no rubs, or gallops Lungs:   CTA b/l, no wheezing, rhonchi or rales Abd: soft, nontender MS: no deformity or  atrophy Ext:  no edema Skin: warm and dry, no rash Neuro:  No gross deficits appreciated Psych: euthymic mood, full affect   EKG:  Done today and reviewed by myself shows  SR 68bpm, stable intervals   05/01/21: stress myoview Nuclear stress EF: 48%. The left ventricular ejection fraction is mildly decreased (45-54%). No T wave inversion was noted during stress. There was no ST segment deviation noted during stress. This is an intermediate risk study.   IMPRESSIONS Negative stress induced arrhythmias. No evidence of ischemia or infarction. Mild decrease in LVEF (48%).     04/05/21: TTE 1. Left ventricular ejection fraction, by estimation, is 60 to 65%. The  left ventricle has normal function. The left ventricle has no regional  wall motion abnormalities. Left ventricular diastolic function could not  be evaluated.   2. Right ventricular systolic function is normal. The right ventricular  size is normal. Tricuspid regurgitation signal is inadequate for assessing  PA pressure.   3. Left atrial size was mildly dilated.   4.  Right atrial size was mildly dilated.   5. The mitral valve is normal in structure. No evidence of mitral valve  regurgitation. No evidence of mitral stenosis.   6. The aortic valve is normal in structure. Aortic valve regurgitation is  not visualized. No aortic stenosis is present.   7. The inferior vena cava is normal in size with greater than 50%  respiratory variability, suggesting right atrial pressure of 3 mmHg.   Recent Labs: 07/23/2022: ALT 18; BUN 24; Creatinine, Ser 0.98; Potassium 5.0; Sodium 138  07/23/2022: Cholesterol, Total  111; HDL 33; LDL Chol Calc (NIH) 55; Triglycerides 130   CrCl cannot be calculated (Patient's most recent lab result is older than the maximum 21 days allowed.).   Wt Readings from Last 3 Encounters:  04/08/23 244 lb 6.4 oz (110.9 kg)  02/10/23 241 lb (109.3 kg)  12/30/22 241 lb (109.3 kg)     Other studies reviewed: Additional studies/records reviewed today include: summarized above  ASSESSMENT AND PLAN:  Pareoxysmal Afib Aflutter  CHA2DS2Vasc is zero, maintained on Eliquis, appropriately dosed Flecainide/metoprolol stable intervals Low/no burden by symptoms Labs today  Disposition: F/u with [place] in 63mo, sooner if needed  Current medicines are reviewed at length with the patient today.  The patient did not have any concerns regarding medicines.  Norma Fredrickson, PA-C 04/08/2023 9:08 AM     [facility] [address] [place] [address] [phone] (office)  [phone] (fax)

## 2023-04-08 ENCOUNTER — Encounter: Payer: Self-pay | Admitting: Physician Assistant

## 2023-04-08 ENCOUNTER — Ambulatory Visit: Payer: No Typology Code available for payment source | Attending: Physician Assistant | Admitting: Physician Assistant

## 2023-04-08 VITALS — BP 100/68 | HR 77 | Ht 71.0 in | Wt 244.4 lb

## 2023-04-08 DIAGNOSIS — Z79899 Other long term (current) drug therapy: Secondary | ICD-10-CM

## 2023-04-08 DIAGNOSIS — I480 Paroxysmal atrial fibrillation: Secondary | ICD-10-CM

## 2023-04-08 DIAGNOSIS — Z5181 Encounter for therapeutic drug level monitoring: Secondary | ICD-10-CM | POA: Diagnosis not present

## 2023-04-08 MED ORDER — APIXABAN 5 MG PO TABS: ORAL_TABLET | ORAL | 2 refills | Status: DC

## 2023-04-08 NOTE — Patient Instructions (Signed)
Medication Instructions:    Your physician recommends that you continue on your current medications as directed. Please refer to the Current Medication list given to you today.   *If you need a refill on your cardiac medications before your next appointment, please call your pharmacy*   Lab Work:  CBC AND BMET TODAY    If you have labs (blood work) drawn today and your tests are completely normal, you will receive your results only by: MyChart Message (if you have MyChart) OR A paper copy in the mail If you have any lab test that is abnormal or we need to change your treatment, we will call you to review the results.   Testing/Procedures:  NONE ORDERED  TODAY    Follow-Up: At [facility], you and your health needs are our priority.  As part of our continuing mission to provide you with exceptional heart care, we have created designated Provider Care Teams.  These Care Teams include your primary Cardiologist (physician) and Advanced Practice Providers (APPs -  Physician Assistants and Nurse Practitioners) who all work together to provide you with the care you need, when you need it.  We recommend signing up for the patient portal called "MyChart".  Sign up information is provided on this After Visit Summary.  MyChart is used to connect with patients for Virtual Visits (Telemedicine).  Patients are able to view lab/test results, encounter notes, upcoming appointments, etc.  Non-urgent messages can be sent to your provider as well.   To learn more about what you can do with MyChart, go to [email or web address].    Your next appointment:   6 month(s)  Provider:   You may see Lanier Prude, MD or one of the following Advanced Practice Providers on your designated Care Team:   Francis Dowse, [place]   Other Instructions

## 2023-04-09 LAB — CBC
Hematocrit: 44.6 % (ref 37.5–51.0)
Hemoglobin: 14.8 g/dL (ref 13.0–17.7)
MCH: 29.3 pg (ref 26.6–33.0)
MCHC: 33.2 g/dL (ref 31.5–35.7)
MCV: 88 fL (ref 79–97)
Platelets: 271 10*3/uL (ref 150–450)
RBC: 5.05 x10E6/uL (ref 4.14–5.80)
RDW: 11.8 % (ref 11.6–15.4)
WBC: 8.2 10*3/uL (ref 3.4–10.8)

## 2023-04-09 LAB — BASIC METABOLIC PANEL
BUN/Creatinine Ratio: 25 — ABNORMAL HIGH (ref 10–24)
BUN: 23 mg/dL (ref 8–27)
CO2: 26 mmol/L (ref 20–29)
Calcium: 9.5 mg/dL (ref 8.6–10.2)
Chloride: 103 mmol/L (ref 96–106)
Creatinine, Ser: 0.93 mg/dL (ref 0.76–1.27)
Glucose: 85 mg/dL (ref 70–99)
Potassium: 5.1 mmol/L (ref 3.5–5.2)
Sodium: 140 mmol/L (ref 134–144)
eGFR: 92 mL/min/{1.73_m2} (ref >59)

## 2023-04-19 ENCOUNTER — Ambulatory Visit (INDEPENDENT_AMBULATORY_CARE_PROVIDER_SITE_OTHER): Payer: No Typology Code available for payment source | Admitting: Family Medicine

## 2023-04-19 ENCOUNTER — Encounter (INDEPENDENT_AMBULATORY_CARE_PROVIDER_SITE_OTHER): Payer: Self-pay | Admitting: Family Medicine

## 2023-04-19 VITALS — BP 103/63 | HR 73 | Temp 98.5°F | Ht 71.0 in | Wt 241.0 lb

## 2023-04-19 DIAGNOSIS — Z6833 Body mass index (BMI) 33.0-33.9, adult: Secondary | ICD-10-CM | POA: Diagnosis not present

## 2023-04-19 DIAGNOSIS — E7849 Other hyperlipidemia: Secondary | ICD-10-CM | POA: Diagnosis not present

## 2023-04-19 DIAGNOSIS — E559 Vitamin D deficiency, unspecified: Secondary | ICD-10-CM

## 2023-04-19 DIAGNOSIS — E669 Obesity, unspecified: Secondary | ICD-10-CM

## 2023-04-19 MED ORDER — SAXENDA 18 MG/3ML ~~LOC~~ SOPN: 3.0000 mg | PEN_INJECTOR | Freq: Every day | SUBCUTANEOUS | 0 refills | Status: DC

## 2023-04-19 NOTE — Progress Notes (Signed)
Chief Complaint:   OBESITY Earl Hogan is here to discuss his progress with his obesity treatment plan along with follow-up of his obesity related diagnoses. Earl Hogan is on the Category 4 Plan and states he is following his eating plan approximately 50% of the time. Earl Hogan states he is doing [employer] Do and walking for 30-60 minutes 2 times per week.  Today's visit was #: 16 Starting weight: 275 lbs Starting date: 02/10/2022 Today's weight: 241 lbs Today's date: 04/19/2023 Total lbs lost to date: 34 Total lbs lost since last in-office visit: 0  Interim History: Patient had a GI virus for about a week and a half and then started GI symptoms again a week ago.  He went back to eating normally a couple days ago.  These illnesses threw him off of eating Category 4 for about 3 weeks.  Wants to recommit to meal plan.  Feels breakfast and lunch are relatively easy but since [place] he has eaten more sweets.   Subjective:   1. Other hyperlipidemia Earl Hogan is on Lipitor daily.  He has no side effects of Lipitor (myalgia or transaminitis).   2. Vitamin D deficiency Earl Hogan is on OTC vitamin D 5000 IU daily.  He denies nausea, vomiting, or muscle weakness but notes fatigue.  Assessment/Plan:   1. Other hyperlipidemia Earl Hogan will continue Lipitor with no change in dose.  2. Vitamin D deficiency Earl Hogan will continue vitamin D 5000 IU daily, and we will repeat labs at his next appointment.  3. BMI 33.0-33.9,adult  4. Obesity with starting BMI of 38.4 We will refill Saxenda 3 mg for 1 month.  - Liraglutide -Weight Management (SAXENDA) 18 MG/3ML SOPN; Inject 3 mg into the skin daily.  Dispense: 45 mL; Refill: 0  Earl Hogan is currently in the action stage of change. As such, his goal is to continue with weight loss efforts. He has agreed to the Category 4 Plan.   We will repeat fasting labs at his next appointment.  Exercise goals: As is.  We discussed the importance of resistance training.  Patient is to start  10-15 minutes of resistance training to times per week in addition.  Behavioral modification strategies: increasing lean protein intake, meal planning and cooking strategies, keeping healthy foods in the home, and planning for success.  Earl Hogan has agreed to follow-up with our clinic in 7 to 8 weeks. He was informed of the importance of frequent follow-up visits to maximize his success with intensive lifestyle modifications for his multiple health conditions.   Objective:   Blood pressure 103/63, pulse 73, temperature 98.5 F (36.9 C), height 5\' 11"  (1.803 m), weight 241 lb (109.3 kg), SpO2 96 %. Body mass index is 33.61 kg/m.  General: Cooperative, alert, well developed, in no acute distress. HEENT: Conjunctivae and lids unremarkable. Cardiovascular: Regular rhythm.  Lungs: Normal work of breathing. Neurologic: No focal deficits.   Lab Results  Component Value Date   CREATININE 0.93 04/08/2023   BUN 23 04/08/2023   NA 140 04/08/2023   K 5.1 04/08/2023   CL 103 04/08/2023   CO2 26 04/08/2023   Lab Results  Component Value Date   ALT 18 07/23/2022   AST 15 07/23/2022   ALKPHOS 86 07/23/2022   BILITOT 0.7 07/23/2022   Lab Results  Component Value Date   HGBA1C 5.6 07/23/2022   HGBA1C 5.6 04/05/2021   Lab Results  Component Value Date   INSULIN 16.9 07/23/2022   INSULIN 14.8 02/10/2022   Lab Results  Component Value Date   TSH 2.271 04/05/2021   Lab Results  Component Value Date   CHOL 111 07/23/2022   HDL 33 (L) 07/23/2022   LDLCALC 55 07/23/2022   TRIG 130 07/23/2022   Lab Results  Component Value Date   VD25OH 41.3 07/23/2022   Lab Results  Component Value Date   WBC 8.2 04/08/2023   HGB 14.8 04/08/2023   HCT 44.6 04/08/2023   MCV 88 04/08/2023   PLT 271 04/08/2023   No results found for: "IRON", "TIBC", "FERRITIN"  Attestation Statements:   Reviewed by clinician on day of visit: allergies, medications, problem list, medical history, surgical  history, family history, social history, and previous encounter notes.   I, Burt Knack, am acting as transcriptionist for Reuben Likes, MD.  I have reviewed the above documentation for accuracy and completeness, and I agree with the above. - Reuben Likes, MD

## 2023-05-23 ENCOUNTER — Other Ambulatory Visit (INDEPENDENT_AMBULATORY_CARE_PROVIDER_SITE_OTHER): Payer: Self-pay | Admitting: Family Medicine

## 2023-05-23 ENCOUNTER — Encounter (INDEPENDENT_AMBULATORY_CARE_PROVIDER_SITE_OTHER): Payer: Self-pay | Admitting: Family Medicine

## 2023-05-24 NOTE — Telephone Encounter (Signed)
45ml sent 04/19/23

## 2023-06-14 ENCOUNTER — Ambulatory Visit (INDEPENDENT_AMBULATORY_CARE_PROVIDER_SITE_OTHER): Payer: 59 | Admitting: Family Medicine

## 2023-06-14 ENCOUNTER — Encounter (INDEPENDENT_AMBULATORY_CARE_PROVIDER_SITE_OTHER): Payer: Self-pay | Admitting: Family Medicine

## 2023-06-14 VITALS — BP 110/69 | HR 68 | Temp 98.1°F | Ht 71.0 in | Wt 245.0 lb

## 2023-06-14 DIAGNOSIS — Z6834 Body mass index (BMI) 34.0-34.9, adult: Secondary | ICD-10-CM

## 2023-06-14 DIAGNOSIS — I4891 Unspecified atrial fibrillation: Secondary | ICD-10-CM

## 2023-06-14 DIAGNOSIS — E669 Obesity, unspecified: Secondary | ICD-10-CM | POA: Diagnosis not present

## 2023-06-14 NOTE — Progress Notes (Signed)
Chief Complaint:   OBESITY Tuan is here to discuss his progress with his obesity treatment plan along with follow-up of his obesity related diagnoses. Caz is on the Category 4 Plan and states he is following his eating plan approximately 65% of the time. Averee states he is doing [employer] Do for 60 minutes 2 times per week.  Today's visit was #: 17 Starting weight: 275 lbs Starting date: 02/10/2022 Today's weight: 245 lbs Today's date: 06/14/2023 Total lbs lost to date: 30 Total lbs lost since last in-office visit: 0  Interim History: Patient feeling depressed and fatigued.  He had both his grandsons all day for the last couple of Fridays then had his wife's grandkids for a bit.  His stepson came back from living in [place] for a year and this has added a different dynamic to the food situation at home. He is trying to stay aware of protein intake and started doing the double protein blue apron again.  He mentions he may also have reached a contentment with current weight plateau.  He has been out and about eating out occasionally as well.  Planning to go to the beach with grandkids mid August and mentions that may be a bit challenging. Does feel the [employer] is working well for him.   Subjective:   1. Atrial fibrillation with rapid ventricular response (HCC) Patient is on Eliquis, flecainide, and Toprol.  He denies symptoms currently.  Assessment/Plan:   1. Atrial fibrillation with rapid ventricular response [facility]) Patient will continue his current medications with no change in treatment or medicine.  2. BMI 34.0-34.9,adult  3. Obesity with starting BMI of 38.4 Daequan is currently in the action stage of change. As such, his goal is to continue with weight loss efforts. He has agreed to the Category 4 Plan.   Exercise goals: As is.   Behavioral modification strategies: increasing lean protein intake, meal planning and cooking strategies, keeping healthy foods in the home, and planning  for success.  Slayton has agreed to follow-up with our clinic in 5 to 6 weeks. He was informed of the importance of frequent follow-up visits to maximize his success with intensive lifestyle modifications for his multiple health conditions.   Objective:   Blood pressure 110/69, pulse 68, temperature 98.1 F (36.7 C), height 5\' 11"  (1.803 m), weight 245 lb (111.1 kg), SpO2 97%. Body mass index is 34.17 kg/m.  General: Cooperative, alert, well developed, in no acute distress. HEENT: Conjunctivae and lids unremarkable. Cardiovascular: Regular rhythm.  Lungs: Normal work of breathing. Neurologic: No focal deficits.   Lab Results  Component Value Date   CREATININE 0.93 04/08/2023   BUN 23 04/08/2023   NA 140 04/08/2023   K 5.1 04/08/2023   CL 103 04/08/2023   CO2 26 04/08/2023   Lab Results  Component Value Date   ALT 18 07/23/2022   AST 15 07/23/2022   ALKPHOS 86 07/23/2022   BILITOT 0.7 07/23/2022   Lab Results  Component Value Date   HGBA1C 5.6 07/23/2022   HGBA1C 5.6 04/05/2021   Lab Results  Component Value Date   INSULIN 16.9 07/23/2022   INSULIN 14.8 02/10/2022   Lab Results  Component Value Date   TSH 2.271 04/05/2021   Lab Results  Component Value Date   CHOL 111 07/23/2022   HDL 33 (L) 07/23/2022   LDLCALC 55 07/23/2022   TRIG 130 07/23/2022   Lab Results  Component Value Date   VD25OH 41.3 07/23/2022  Lab Results  Component Value Date   WBC 8.2 04/08/2023   HGB 14.8 04/08/2023   HCT 44.6 04/08/2023   MCV 88 04/08/2023   PLT 271 04/08/2023   No results found for: "IRON", "TIBC", "FERRITIN"  Attestation Statements:   Reviewed by clinician on day of visit: allergies, medications, problem list, medical history, surgical history, family history, social history, and previous encounter notes.  Time spent on visit including pre-visit chart review and post-visit care and charting was 30 minutes.   I, Burt Knack, am acting as transcriptionist for  Reuben Likes, MD.  I have reviewed the above documentation for accuracy and completeness, and I agree with the above. - Reuben Likes, MD

## 2023-07-09 DIAGNOSIS — I480 Paroxysmal atrial fibrillation: Secondary | ICD-10-CM

## 2023-07-09 MED ORDER — APIXABAN 5 MG PO TABS: ORAL_TABLET | ORAL | 1 refills | Status: DC

## 2023-07-09 NOTE — Telephone Encounter (Signed)
Prescription refill request for Eliquis received. Indication: Afib  Last office visit: 04/08/23 Earl Hogan)  Scr: 0.93 (04/08/23)  Age: 62 Weight: 111.1kg  Appropriate dose. Refill sent.

## 2023-07-19 ENCOUNTER — Ambulatory Visit (INDEPENDENT_AMBULATORY_CARE_PROVIDER_SITE_OTHER): Payer: No Typology Code available for payment source | Admitting: Family Medicine

## 2023-07-19 ENCOUNTER — Encounter (INDEPENDENT_AMBULATORY_CARE_PROVIDER_SITE_OTHER): Payer: Self-pay | Admitting: Family Medicine

## 2023-07-19 VITALS — BP 102/65 | HR 76 | Temp 97.9°F | Ht 71.0 in | Wt 244.0 lb

## 2023-07-19 DIAGNOSIS — Z6834 Body mass index (BMI) 34.0-34.9, adult: Secondary | ICD-10-CM

## 2023-07-19 DIAGNOSIS — F419 Anxiety disorder, unspecified: Secondary | ICD-10-CM | POA: Diagnosis not present

## 2023-07-19 DIAGNOSIS — G4733 Obstructive sleep apnea (adult) (pediatric): Secondary | ICD-10-CM

## 2023-07-19 DIAGNOSIS — E669 Obesity, unspecified: Secondary | ICD-10-CM | POA: Diagnosis not present

## 2023-07-19 DIAGNOSIS — E7849 Other hyperlipidemia: Secondary | ICD-10-CM

## 2023-07-19 NOTE — Progress Notes (Signed)
Chief Complaint:   OBESITY Earl Hogan is here to discuss his progress with his obesity treatment plan along with follow-up of his obesity related diagnoses. Earl Hogan is on the Category 4 Plan and states he is following his eating plan approximately 65% of the time. Earl Hogan states he is doing [employer] and walking 1.5 miles for 60 minutes 1-2 times per week.  Today's visit was #: 18 Starting weight: 275 lbs Starting date: 02/10/2022 Today's weight: 244 lbs Today's date: 07/19/2023 Total lbs lost to date: 31 Total lbs lost since last in-office visit: 1  Interim History: Patient went the the beach since last appointment.  He mentions this was quite active but he did do some snacking.  He also had a [employer] tournament in which he medaled in the forms and board breaking. He sticks with Category 4 for breakfast and lunch fairly well.  He is focusing on protein and making sure he eats that first.  He is trying to avoid fried foods for the most part. 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    Subjective:   1. Other hyperlipidemia Patient's last labs at healthy weight [facility] wellness were last year.  He is on Lipitor, and he sees Cardiology.  2. OSA (obstructive sleep apnea) Patient did not get his CPAP.  His sleep study was done in 2022.  He does have atrial fibrillation.  3. Anxiety Patient is on Lexapro 10 mg, and he notes insomnia and ruminating thoughts at night.  Assessment/Plan:   1. Other hyperlipidemia Patient will continue Lipitor with no change in medication or dose.  2. OSA (obstructive sleep apnea) Patient is to follow-up with cardiology at his next scheduled appointment.  3. Anxiety Patient agreed to increase Lexapro to 15 mg daily for 2 weeks, then he can decrease if there is no difference.  4. BMI 34.0-34.9,adult  5. Obesity with starting BMI of 38.4 Earl Hogan is currently in the action stage of change. As such, his goal is to continue with weight loss  efforts. He has agreed to the Category 4 Plan.   Patient will continue Saxenda at his current dose (patient is at 3 mg which is the max treatment dose).   Exercise goals: As is.   Behavioral modification strategies: increasing lean protein intake, meal planning and cooking strategies, keeping healthy foods in the home, and planning for success.  Earl Hogan has agreed to follow-up with our clinic in 6 weeks. He was informed of the importance of frequent follow-up visits to maximize his success with intensive lifestyle modifications for his multiple health conditions.   Objective:   Blood pressure 102/65, pulse 76, temperature 97.9 F (36.6 C), height 5\' 11"  (1.803 m), weight 244 lb (110.7 kg), SpO2 94%. Body mass index is 34.03 kg/m.  General: Cooperative, alert, well developed, in no acute distress. HEENT: Conjunctivae and lids unremarkable. Cardiovascular: Regular rhythm.  Lungs: Normal work of breathing. Neurologic: No focal deficits.   Lab Results  Component Value Date   CREATININE 0.93 04/08/2023   BUN 23 04/08/2023   NA 140 04/08/2023   K 5.1 04/08/2023   CL 103 04/08/2023   CO2 26 04/08/2023   Lab Results  Component Value Date   ALT 18 07/23/2022   AST 15 07/23/2022   ALKPHOS 86 07/23/2022   BILITOT 0.7 07/23/2022   Lab Results  Component Value Date   HGBA1C 5.6 07/23/2022   HGBA1C 5.6 04/05/2021   Lab Results  Component Value Date   INSULIN 16.9 07/23/2022   INSULIN 14.8 02/10/2022   Lab Results  Component Value Date   TSH 2.271 04/05/2021   Lab Results  Component Value Date   CHOL 111 07/23/2022   HDL 33 (L) 07/23/2022   LDLCALC 55 07/23/2022   TRIG 130 07/23/2022   Lab Results  Component Value Date   VD25OH 41.3 07/23/2022   Lab Results  Component Value Date   WBC 8.2 04/08/2023   HGB 14.8 04/08/2023   HCT 44.6 04/08/2023   MCV 88 04/08/2023   PLT 271 04/08/2023   No results found for: "IRON", "TIBC", "FERRITIN"  Attestation Statements:    Reviewed by clinician on day of visit: allergies, medications, problem list, medical history, surgical history, family history, social history, and previous encounter notes.   I, Burt Knack, am acting as transcriptionist for Reuben Likes, MD.  I have reviewed the above documentation for accuracy and completeness, and I agree with the above. - Reuben Likes, MD

## 2023-08-22 ENCOUNTER — Other Ambulatory Visit: Payer: Self-pay | Admitting: Cardiology

## 2023-09-01 ENCOUNTER — Ambulatory Visit (INDEPENDENT_AMBULATORY_CARE_PROVIDER_SITE_OTHER): Payer: 59 | Admitting: Family Medicine

## 2023-09-01 ENCOUNTER — Encounter (INDEPENDENT_AMBULATORY_CARE_PROVIDER_SITE_OTHER): Payer: Self-pay | Admitting: Family Medicine

## 2023-09-01 VITALS — BP 110/68 | HR 85 | Temp 98.3°F | Ht 71.0 in | Wt 243.0 lb

## 2023-09-01 DIAGNOSIS — E7849 Other hyperlipidemia: Secondary | ICD-10-CM

## 2023-09-01 DIAGNOSIS — Z6838 Body mass index (BMI) 38.0-38.9, adult: Secondary | ICD-10-CM

## 2023-09-01 DIAGNOSIS — E669 Obesity, unspecified: Secondary | ICD-10-CM | POA: Diagnosis not present

## 2023-09-01 DIAGNOSIS — R7303 Prediabetes: Secondary | ICD-10-CM

## 2023-09-01 MED ORDER — SAXENDA 18 MG/3ML ~~LOC~~ SOPN
3.0000 mg | PEN_INJECTOR | Freq: Every day | SUBCUTANEOUS | 2 refills | Status: DC
Start: 2023-09-01 — End: 2023-10-27

## 2023-09-01 NOTE — Progress Notes (Signed)
Chief Complaint:   OBESITY Earl Hogan is here to discuss his progress with his obesity treatment plan along with follow-up of his obesity related diagnoses. Earl Hogan is on the Category 4 Plan and states he is following his eating plan approximately 70% of the time. Earl Hogan states he is doing [employer] and walking 1 mile for 60 minutes 1 time per week.  Today's visit was #: 19 Starting weight: 275 lbs Starting date: 02/10/2022 Today's weight: 243 lbs Today's date: 09/01/2023 Total lbs lost to date: 32 Total lbs lost since last in-office visit: 1  Interim History: Patient presents for follow up today.  He got Covid and flu shot 3 days ago and has been dealing with stomach/ GI issues for a few weeks. He feels he and his wife have both been dealing with quite a bit of stress.  He was out of town at [employer] recently and so ate off plan more than he did previously. For the next few weeks he is planning on going to a few [employer] games.   Subjective:   1. Prediabetes Patient's A1c was done at a wellness fair and was of 5.8.  He is on GLP-1.  2. Other hyperlipidemia Patient is on Lipitor daily, and he denies myalgias.  Assessment/Plan:   1. Prediabetes Patient will continue GLP-1 until the end of his coverage.  We will discuss options at his next appointment (patient is to reach out to his insurance to discuss coverage).  2. Other hyperlipidemia Patient will continue Lipitor daily with no change in medication or dose.  3. Obesity with starting BMI of 38.4 We will refill Saxenda 3 mg subcu weekly for 90 days.  - Liraglutide -Weight Management (SAXENDA) 18 MG/3ML SOPN; Inject 3 mg into the skin daily.  Dispense: 45 mL; Refill: 2  Earl Hogan is currently in the action stage of change. As such, his goal is to continue with weight loss efforts. He has agreed to the Category 4 Plan.   Exercise goals: As is.   Behavioral modification strategies: increasing lean protein intake, meal planning  and cooking strategies, keeping healthy foods in the home, and planning for success.  Earl Hogan has agreed to follow-up with our clinic in 8 weeks. He was informed of the importance of frequent follow-up visits to maximize his success with intensive lifestyle modifications for his multiple health conditions.   Objective:   Blood pressure 110/68, pulse 85, temperature 98.3 F (36.8 C), height 5\' 11"  (1.803 m), weight 243 lb (110.2 kg), SpO2 97%. Body mass index is 33.89 kg/m.  General: Cooperative, alert, well developed, in no acute distress. HEENT: Conjunctivae and lids unremarkable. Cardiovascular: Regular rhythm.  Lungs: Normal work of breathing. Neurologic: No focal deficits.   Lab Results  Component Value Date   CREATININE 0.93 04/08/2023   BUN 23 04/08/2023   NA 140 04/08/2023   K 5.1 04/08/2023   CL 103 04/08/2023   CO2 26 04/08/2023   Lab Results  Component Value Date   ALT 18 07/23/2022   AST 15 07/23/2022   ALKPHOS 86 07/23/2022   BILITOT 0.7 07/23/2022   Lab Results  Component Value Date   HGBA1C 5.6 07/23/2022   HGBA1C 5.6 04/05/2021   Lab Results  Component Value Date   INSULIN 16.9 07/23/2022   INSULIN 14.8 02/10/2022   Lab Results  Component Value Date   TSH 2.271 04/05/2021   Lab Results  Component Value Date   CHOL 111 07/23/2022  HDL 33 (L) 07/23/2022   LDLCALC 55 07/23/2022   TRIG 130 07/23/2022   Lab Results  Component Value Date   VD25OH 41.3 07/23/2022   Lab Results  Component Value Date   WBC 8.2 04/08/2023   HGB 14.8 04/08/2023   HCT 44.6 04/08/2023   MCV 88 04/08/2023   PLT 271 04/08/2023   No results found for: "IRON", "TIBC", "FERRITIN"  Attestation Statements:   Reviewed by clinician on day of visit: allergies, medications, problem list, medical history, surgical history, family history, social history, and previous encounter notes.   I, Burt Knack, am acting as transcriptionist for Reuben Likes, MD.  I have  reviewed the above documentation for accuracy and completeness, and I agree with the above. - Reuben Likes, MD

## 2023-09-06 ENCOUNTER — Other Ambulatory Visit: Payer: Self-pay | Admitting: Cardiology

## 2023-09-27 ENCOUNTER — Encounter (INDEPENDENT_AMBULATORY_CARE_PROVIDER_SITE_OTHER): Payer: Self-pay | Admitting: Family Medicine

## 2023-09-27 NOTE — Telephone Encounter (Signed)
Please advise 

## 2023-10-11 ENCOUNTER — Ambulatory Visit: Payer: No Typology Code available for payment source | Admitting: Cardiology

## 2023-10-20 NOTE — Progress Notes (Unsigned)
  Electrophysiology Office Note:   Date:  10/21/2023  ID:  Earl Hogan, DOB [id number], MRN [id number]  Primary Cardiologist: Armanda Magic, MD Electrophysiologist: Lanier Prude, MD      History of Present Illness:   Earl Hogan is a 62 y.o. male with h/o paroxysmal AF, HLD, anxiety seen today for routine electrophysiology followup.   Follows with [name] for AF.  During a typical episode, he has chest tightness, palpitations and dizziness. He monitors with an Scientist, physiological.  Since last being seen in our clinic the patient reports he has not had any episodes of AF/AFL that he is aware.  He will check his rhythm on his Apple watch occasionally and has been in SR. He continues to do [place] and walks his dog.   He denies chest pain, palpitations, dyspnea, PND, orthopnea, nausea, vomiting, dizziness, syncope, edema, weight gain, or early satiety.   Review of systems complete and found to be negative unless listed in HPI.   EP Information / Studies Reviewed:    EKG is ordered today. Personal review as below.  EKG Interpretation Date/Time:  Thursday October 21 2023 08:07:34 EST Ventricular Rate:  70 PR Interval:  206 QRS Duration:  98 QT Interval:  394 QTC Calculation: 425 R Axis:   43  Text Interpretation: Normal sinus rhythm Confirmed by Canary Brim ([id number]) on 10/21/2023 8:12:47 AM   Studies:  ECHO 03/2021 > LVEF 60-65%, no RWMA, LA / RA mildly dilated,  Stress Myoview 04/2021 > LVEF 48%, negative stress induced arrhythmias, no evidence of ischemia or infarction.  Arrhythmia / AAD AF/AFL > initial dx in 03/2021  Flecainide > started 03/2021     Risk Assessment/Calculations:    CHA2DS2-VASc Score = 0   This indicates a 0.2% annual risk of stroke. The patient's score is based upon: CHF History: 0 HTN History: 0 Diabetes History: 0 Stroke History: 0 Vascular Disease History: 0 Age Score: 0 Gender Score: 0             Physical Exam:   VS:  BP (!)  102/58   Pulse 70   Ht 5\' 11"  (1.803 m)   Wt 249 lb 3.2 oz (113 kg)   SpO2 94%   BMI 34.76 kg/m    Wt Readings from Last 3 Encounters:  10/21/23 249 lb 3.2 oz (113 kg)  09/01/23 243 lb (110.2 kg)  07/19/23 244 lb (110.7 kg)     GEN: Well nourished, well developed in no acute distress NECK: No JVD; No carotid bruits CARDIAC: Regular rate and rhythm, no murmurs, rubs, gallops RESPIRATORY:  Clear to auscultation without rales, wheezing or rhonchi  ABDOMEN: Soft, non-tender, non-distended EXTREMITIES:  No edema; No deformity   ASSESSMENT AND PLAN:    Paroxysmal Atrial Fibrillation AFL   High Risk Medication Monitoring - Flecainide  CHA2DS2-VASc 0  -OAC as below for stroke prophylaxis  -continue flecainide & metoprolol   -EKG with stable intervals    -monitors with an Apple Watch  -no symptom burden in last 6 months -discussed option of catheter ablation / Watchman if patient is interested.  He currently is happy with his control and regimen with no issues.  Secondary Hypercoagulable State -continue Eliquis, dose reviewed, appropriate by age/wt   Follow up with [name] or EP APP in 6 months  Signed, Canary Brim, MSN, APRN, NP-C, AGACNP-BC [facility] - Electrophysiology  10/21/2023, 8:16 AM

## 2023-10-21 ENCOUNTER — Encounter: Payer: Self-pay | Admitting: Pulmonary Disease

## 2023-10-21 ENCOUNTER — Ambulatory Visit: Payer: 59 | Attending: Cardiology | Admitting: Pulmonary Disease

## 2023-10-21 VITALS — BP 102/58 | HR 70 | Ht 71.0 in | Wt 249.2 lb

## 2023-10-21 DIAGNOSIS — I480 Paroxysmal atrial fibrillation: Secondary | ICD-10-CM | POA: Diagnosis not present

## 2023-10-21 DIAGNOSIS — Z79899 Other long term (current) drug therapy: Secondary | ICD-10-CM

## 2023-10-21 NOTE — Patient Instructions (Addendum)
Medication Instructions:  Your physician recommends that you continue on your current medications as directed. Please refer to the Current Medication list given to you today.  *If you need a refill on your cardiac medications before your next appointment, please call your pharmacy*  Lab Work: None ordered.  If you have labs (blood work) drawn today and your tests are completely normal, you will receive your results only by: MyChart Message (if you have MyChart) OR A paper copy in the mail If you have any lab test that is abnormal or we need to change your treatment, we will call you to review the results.  Testing/Procedures: None ordered.  Follow-Up: At [facility], you and your health needs are our priority.  As part of our continuing mission to provide you with exceptional heart care, we have created designated Provider Care Teams.  These Care Teams include your primary Cardiologist (physician) and Advanced Practice Providers (APPs -  Physician Assistants and Nurse Practitioners) who all work together to provide you with the care you need, when you need it.  Your next appointment:   6 months  The format for your next appointment:   In Person  Provider:   Gaye Alken or Dr Lalla Brothers   Important Information About Sugar

## 2023-10-27 ENCOUNTER — Encounter (INDEPENDENT_AMBULATORY_CARE_PROVIDER_SITE_OTHER): Payer: Self-pay | Admitting: Family Medicine

## 2023-10-27 ENCOUNTER — Ambulatory Visit (INDEPENDENT_AMBULATORY_CARE_PROVIDER_SITE_OTHER): Payer: 59 | Admitting: Family Medicine

## 2023-10-27 DIAGNOSIS — Z6833 Body mass index (BMI) 33.0-33.9, adult: Secondary | ICD-10-CM | POA: Diagnosis not present

## 2023-10-27 DIAGNOSIS — R7303 Prediabetes: Secondary | ICD-10-CM

## 2023-10-27 DIAGNOSIS — I4891 Unspecified atrial fibrillation: Secondary | ICD-10-CM | POA: Diagnosis not present

## 2023-10-27 MED ORDER — SAXENDA 18 MG/3ML ~~LOC~~ SOPN
3.0000 mg | PEN_INJECTOR | Freq: Every day | SUBCUTANEOUS | 0 refills | Status: DC
Start: 2023-10-27 — End: 2023-12-09

## 2023-10-27 MED ORDER — OZEMPIC (0.25 OR 0.5 MG/DOSE) 2 MG/3ML ~~LOC~~ SOPN
0.2500 mg | PEN_INJECTOR | SUBCUTANEOUS | 0 refills | Status: DC
Start: 2023-10-27 — End: 2023-12-09

## 2023-10-27 NOTE — Assessment & Plan Note (Signed)
Patient called insurance and was told Ozempic would be covered under this diagnosis.  He previously did very well on Mounjaro and is now on Saxenda with decent control of blood sugars as well as carbohydrate control. Ozempic prescription sent in today to see if prior authorization approved.

## 2023-10-27 NOTE — Assessment & Plan Note (Signed)
Patient currently on Saxenda with decent control of food intake and carb intake.  He has not experienced any GI side effects such as nausea or vomiting and denies any abdominal pain.  Insurance will no longer cover Saxenda next year.  We discussed tapering the medication and tying to see if insurance would cover Ozempic for his pre diabetes.  We also discussed using Qsymia if Ozempic is not covered.  Refill of Saxenda sent in and will follow up at next appointment as to what medication options are available in the next insurance year.

## 2023-10-27 NOTE — Assessment & Plan Note (Signed)
Sees cardiology.  On flecainide and eliquis.  No side effects of either medication.  We discussed possible interference with atrial fibrillation with usage of qsymia and will reach out to cardiology if Ozempic is not covered to see if they would clear Qsymia usage.

## 2023-10-27 NOTE — Progress Notes (Signed)
SUBJECTIVE:  Chief Complaint: Obesity  Interim History: Patient has been trying to stay the course in terms of his meal plan over the last 8 weeks.  He got up to 247lbs but got more regimented with his food intake.  His wife is Management consultant in [employer] and that has been very demanding.  He is hoping to find some time to relax in the next few weeks.  He has been consistent with [employer].   Earl Hogan is here to discuss his progress with his obesity treatment plan. He is on the Category 4 Plan and states he is following his eating plan approximately 70 % of the time. He states he is doing [employer] 60 minutes 2 times per week.   OBJECTIVE: Visit Diagnoses: Problem List Items Addressed This Visit       Cardiovascular and Mediastinum   Atrial fibrillation with rapid ventricular response [facility])    Sees cardiology.  On flecainide and eliquis.  No side effects of either medication.  We discussed possible interference with atrial fibrillation with usage of qsymia and will reach out to cardiology if Ozempic is not covered to see if they would clear Qsymia usage.        Other   Pre-diabetes    Patient called insurance and was told Ozempic would be covered under this diagnosis.  He previously did very well on Mounjaro and is now on Saxenda with decent control of blood sugars as well as carbohydrate control. Ozempic prescription sent in today to see if prior authorization approved.        Severe obesity (BMI 35.0-35.9 with comorbidity) (HCC)    Patient currently on Saxenda with decent control of food intake and carb intake.  He has not experienced any GI side effects such as nausea or vomiting and denies any abdominal pain.  Insurance will no longer cover Saxenda next year.  We discussed tapering the medication and tying to see if insurance would cover Ozempic for his pre diabetes.  We also discussed using Qsymia if Ozempic is not covered.  Refill of Saxenda sent in and will follow up at  next appointment as to what medication options are available in the next insurance year.      Relevant Medications   Semaglutide,0.25 or 0.5MG /DOS, (OZEMPIC, 0.25 OR 0.5 MG/DOSE,) 2 MG/3ML SOPN   Liraglutide -Weight Management (SAXENDA) 18 MG/3ML SOPN   Other Visit Diagnoses     Obesity with starting BMI of 38.4    -  Primary   Relevant Medications   Semaglutide,0.25 or 0.5MG /DOS, (OZEMPIC, 0.25 OR 0.5 MG/DOSE,) 2 MG/3ML SOPN   Liraglutide -Weight Management (SAXENDA) 18 MG/3ML SOPN   Prediabetes       Relevant Medications   Semaglutide,0.25 or 0.5MG /DOS, (OZEMPIC, 0.25 OR 0.5 MG/DOSE,) 2 MG/3ML SOPN   BMI 33.0-33.9,adult           Vitals Temp: 97.9 F (36.6 C) BP: 99/62 Pulse Rate: 75 SpO2: 96 %   Anthropometric Measurements Height: 5\' 11"  (1.803 m) Weight: 242 lb (109.8 kg) BMI (Calculated): 33.77 Weight at Last Visit: 243 ln Weight Lost Since Last Visit: 1 Weight Gained Since Last Visit: 0 Starting Weight: 275 lb Total Weight Loss (lbs): 32 lb (14.5 kg)   Body Composition  Body Fat %: 33.7 % Fat Mass (lbs): 81.8 lbs Muscle Mass (lbs): 153 lbs Total Body Water (lbs): 117.2 lbs Visceral Fat Rating : 20   Other Clinical Data Today's Visit #: 20 Starting Date:  02/10/22     ASSESSMENT AND PLAN:  Diet: Earl Hogan is currently in the action stage of change. As such, his goal is to continue with weight loss efforts. He has agreed to Category 4 Plan.  Exercise: Earl Hogan has been instructed to work up to a goal of 150 minutes of combined cardio and strengthening exercise per week and to continue exercising as is for weight loss and overall health benefits.   Behavior Modification:  We discussed the following Behavioral Modification Strategies today: increasing lean protein intake, increasing vegetables, meal planning and cooking strategies, avoiding temptations, and planning for success.   No follow-ups on file.Marland Kitchen He was informed of the importance of frequent follow  up visits to maximize his success with intensive lifestyle modifications for his multiple health conditions.  Attestation Statements:   Reviewed by clinician on day of visit: allergies, medications, problem list, medical history, surgical history, family history, social history, and previous encounter notes.       Earl Likes, MD

## 2023-11-01 ENCOUNTER — Encounter (INDEPENDENT_AMBULATORY_CARE_PROVIDER_SITE_OTHER): Payer: Self-pay

## 2023-11-16 ENCOUNTER — Encounter (INDEPENDENT_AMBULATORY_CARE_PROVIDER_SITE_OTHER): Payer: Self-pay | Admitting: Family Medicine

## 2023-11-16 NOTE — Telephone Encounter (Signed)
Please review

## 2023-12-09 ENCOUNTER — Ambulatory Visit (INDEPENDENT_AMBULATORY_CARE_PROVIDER_SITE_OTHER): Payer: 59 | Admitting: Family Medicine

## 2023-12-09 ENCOUNTER — Encounter (INDEPENDENT_AMBULATORY_CARE_PROVIDER_SITE_OTHER): Payer: Self-pay | Admitting: Family Medicine

## 2023-12-09 VITALS — BP 105/66 | HR 51 | Temp 97.6°F | Ht 71.0 in | Wt 246.0 lb

## 2023-12-09 DIAGNOSIS — Z6834 Body mass index (BMI) 34.0-34.9, adult: Secondary | ICD-10-CM

## 2023-12-09 DIAGNOSIS — G4733 Obstructive sleep apnea (adult) (pediatric): Secondary | ICD-10-CM | POA: Insufficient documentation

## 2023-12-09 DIAGNOSIS — I4891 Unspecified atrial fibrillation: Secondary | ICD-10-CM | POA: Diagnosis not present

## 2023-12-09 DIAGNOSIS — E669 Obesity, unspecified: Secondary | ICD-10-CM

## 2023-12-09 MED ORDER — ZEPBOUND 2.5 MG/0.5ML ~~LOC~~ SOAJ
2.5000 mg | SUBCUTANEOUS | 0 refills | Status: DC
Start: 2023-12-09 — End: 2024-01-24

## 2023-12-09 NOTE — Progress Notes (Signed)
   SUBJECTIVE:  Chief Complaint: Obesity  Interim History: Patient had a good holiday season.  He stayed mostly local.  Went to [place] to visit his son.  Insurance is not programmer, multimedia or saxenda  currently.  Wondering if insurance will cover Zepbound  for his OSA.  Wants to continue to be more consistent with his meal plan over the next few weeks.  Manjinder is here to discuss his progress with his obesity treatment plan. He is on the Category 4 Plan and states he is following his eating plan approximately 50 % of the time. He states he is back to exercising now.   OBJECTIVE: Visit Diagnoses: Problem List Items Addressed This Visit       Cardiovascular and Mediastinum   Atrial fibrillation with rapid ventricular response (HCC)   Patient not comfortable with trying combo phentermine and topiramate. Currently on toprol  and eliquis  and flecainide . Follow up with cardiology at next scheduled appointment.        Respiratory   OSA (obstructive sleep apnea) - Primary   Sleep study on May 2022 showing severe obstructive sleep apnea with AHI of 57/hr.  Oxygen saturations as low as 73%. Concurrent atrial fibrillation.  Prescription for Zepbound  sent in.      Relevant Medications   tirzepatide  (ZEPBOUND ) 2.5 MG/0.5ML Pen   Other Visit Diagnoses       Obesity with starting BMI of 38.4       Relevant Medications   tirzepatide  (ZEPBOUND ) 2.5 MG/0.5ML Pen     BMI 34.0-34.9,adult           Vitals Temp: 97.6 F (36.4 C) BP: 105/66 Pulse Rate: (!) 51 SpO2: 98 %   Anthropometric Measurements Height: 5' 11 (1.803 m) Weight: 246 lb (111.6 kg) BMI (Calculated): 34.33 Weight at Last Visit: 242 lb Weight Lost Since Last Visit: 0 Weight Gained Since Last Visit: 4 lb Starting Weight: 275 lb Total Weight Loss (lbs): 29 lb (13.2 kg)   Body Composition  Body Fat %: 34.9 % Fat Mass (lbs): 86 lbs Muscle Mass (lbs): 152.6 lbs Total Body Water (lbs): 123 lbs Visceral Fat Rating :  20   Other Clinical Data Today's Visit #: 21 Starting Date: 02/10/22     ASSESSMENT AND PLAN:  Diet: Jed is currently in the action stage of change. As such, his goal is to continue with weight loss efforts. He has agreed to Category 4 Plan.  Wants to get more consistently back in the habit of breakfast and lunch now that he is back in his normal routine.  Exercise: Hilmer has been instructed that some exercise is better than none for weight loss and overall health benefits.   Behavior Modification:  We discussed the following Behavioral Modification Strategies today: increasing lean protein intake, increasing vegetables, no skipping meals, avoiding temptations, and planning for success.   No follow-ups on file.SABRA He was informed of the importance of frequent follow up visits to maximize his success with intensive lifestyle modifications for his multiple health conditions.  Attestation Statements:   Reviewed by clinician on day of visit: allergies, medications, problem list, medical history, surgical history, family history, social history, and previous encounter notes.    Adelita Cho, MD

## 2023-12-09 NOTE — Assessment & Plan Note (Signed)
 Patient not comfortable with trying combo phentermine and topiramate. Currently on toprol and eliquis and flecainide. Follow up with cardiology at next scheduled appointment.

## 2023-12-09 NOTE — Assessment & Plan Note (Addendum)
 Sleep study on May 2022 showing severe obstructive sleep apnea with AHI of 57/hr.  Oxygen saturations as low as 73%. Concurrent atrial fibrillation.  Prescription for Zepbound sent in.

## 2023-12-22 NOTE — Telephone Encounter (Signed)
Working on [place]- started 12/22/23

## 2024-01-19 ENCOUNTER — Ambulatory Visit (INDEPENDENT_AMBULATORY_CARE_PROVIDER_SITE_OTHER): Payer: 59 | Admitting: Family Medicine

## 2024-01-20 ENCOUNTER — Telehealth (INDEPENDENT_AMBULATORY_CARE_PROVIDER_SITE_OTHER): Payer: Self-pay

## 2024-01-20 NOTE — Telephone Encounter (Signed)
 Dear Loraine Leriche Valorie Roosevelt: You or your doctor recently requested prescription coverage for ZEPBOUND 2.5/0.5 PEN. After careful consideration and review of your prescription plan's drug list and the information sent to [place], this request was not approved. We understand that this decision may not be what you and your doctor expected. This letter and the enclosed information will explain your options and help you decide what to do next.  Why your request was denied: Plan Exclusion

## 2024-01-24 ENCOUNTER — Encounter (INDEPENDENT_AMBULATORY_CARE_PROVIDER_SITE_OTHER): Payer: Self-pay | Admitting: Family Medicine

## 2024-01-24 ENCOUNTER — Ambulatory Visit (INDEPENDENT_AMBULATORY_CARE_PROVIDER_SITE_OTHER): Payer: 59 | Admitting: Family Medicine

## 2024-01-24 VITALS — BP 113/71 | HR 51 | Temp 97.9°F | Ht 71.0 in | Wt 247.0 lb

## 2024-01-24 DIAGNOSIS — Z6834 Body mass index (BMI) 34.0-34.9, adult: Secondary | ICD-10-CM

## 2024-01-24 DIAGNOSIS — Z6835 Body mass index (BMI) 35.0-35.9, adult: Secondary | ICD-10-CM

## 2024-01-24 DIAGNOSIS — I4891 Unspecified atrial fibrillation: Secondary | ICD-10-CM

## 2024-01-24 DIAGNOSIS — R7303 Prediabetes: Secondary | ICD-10-CM | POA: Diagnosis not present

## 2024-01-24 NOTE — Progress Notes (Signed)
 SUBJECTIVE:  Chief Complaint: Obesity  Interim History: Patient celebrated his birthday and got his purple belt in [place] [employer].  He ended up going out to fish bones.  Recently got labs done at PCP and his A1c went up to 5.7.  He is noticing his hunger is increasing off of saxenda.  He realizes his food intake has increased at meals and for snacks.  Thinks he is getting all the protein in at all the meals but thinks his quantity of rice and pasta has increased since coming off Saxenda.  Currently in the phase of having higher amount of simple carbohydrates available.   Kia is here to discuss his progress with his obesity treatment plan. He is on the Category 4 Plan and states he is following his eating plan approximately 70 % of the time. He states he is exercising 60 minutes 2 times per week.   OBJECTIVE: Visit Diagnoses: Problem List Items Addressed This Visit       Cardiovascular and Mediastinum   Atrial fibrillation with rapid ventricular response (HCC) - Primary   Patient is currently on eliquis, flecainide and toprol for management.  Last EF of 48%.  He is interested in pursuing [phone] as an option for pharmacologic treatment of his cardiovascular issues.          Other   Pre-diabetes   A1c in prediabetic range.  Unfortunately his insurance plan did not cover Zepbound for OSA which could also help with blood sugar regulation.  Will need repeat labs with PCP at normally scheduled appointment.      Severe obesity (BMI 35.0-35.9 with comorbidity) [facility])   Other Clinical Data Today's Visit #: 22 Starting Date: 02/10/22 Comments: Cat 4  Anthropometric Measurements Height: 5\' 11"  (1.803 m) Weight: 247 lb (112 kg) BMI (Calculated): 34.46 Weight at Last Visit: 246 lb Weight Lost Since Last Visit: 0 Weight Gained Since Last Visit: 1 Starting Weight: 275 lb Total Weight Loss (lbs): 28 lb (12.7 kg)  Body Composition  Body Fat %: 34.7 % Fat Mass (lbs): 85.8 lbs Muscle Mass  (lbs): 153.6 lbs Total Body Water (lbs): 124 lbs Visceral Fat Rating : 20   Patient to continue to work on adhering to Category 4 plan.  Will follow up on Mychart if any changes need to be made prior to next appointment.      Other Visit Diagnoses       Obesity with starting BMI of 38.4         BMI 34.0-34.9,adult           No data recorded      01/24/2024    3:50 PM 01/24/2024    3:00 PM 12/09/2023    3:00 PM  Vitals with BMI  Height  5\' 11"  5\' 11"   Weight  247 lbs 246 lbs  BMI  34.46 34.33  Systolic 113 143 045  Diastolic 71 73 66  Pulse  51 51       ASSESSMENT AND PLAN:  Diet: Amman is currently in the action stage of change. As such, his goal is to continue with weight loss efforts and has agreed to the Category 4 Plan.   Exercise:  For additional and more extensive health benefits, adults should increase their aerobic physical activity to 300 minutes (5 hours) a week of moderate-intensity, or 150 minutes a week of vigorous-intensity aerobic physical activity, or an equivalent combination of moderate- and vigorous-intensity activity. Additional health benefits are gained by engaging in  physical activity beyond this amount.   Behavior Modification:  We discussed the following Behavioral Modification Strategies today: increasing lean protein intake, decreasing simple carbohydrates, increasing vegetables, and better snacking choices.   Return in about 6 weeks (around 03/06/2024) for repeat IC.Marland Kitchen He was informed of the importance of frequent follow up visits to maximize his success with intensive lifestyle modifications for his multiple health conditions.  Attestation Statements:   Reviewed by clinician on day of visit: allergies, medications, problem list, medical history, surgical history, family history, social history, and previous encounter notes.    Reuben Likes, MD

## 2024-01-24 NOTE — Assessment & Plan Note (Signed)
 Patient is currently on eliquis, flecainide and toprol for management.  Last EF of 48%.  He is interested in pursuing [phone] as an option for pharmacologic treatment of his cardiovascular issues.

## 2024-01-31 NOTE — Assessment & Plan Note (Signed)
 A1c in prediabetic range.  Unfortunately his insurance plan did not cover Zepbound for OSA which could also help with blood sugar regulation.  Will need repeat labs with PCP at normally scheduled appointment.

## 2024-01-31 NOTE — Assessment & Plan Note (Signed)
 Other Clinical Data Today's Visit #: 22 Starting Date: 02/10/22 Comments: Cat 4  Anthropometric Measurements Height: 5\' 11"  (1.803 m) Weight: 247 lb (112 kg) BMI (Calculated): 34.46 Weight at Last Visit: 246 lb Weight Lost Since Last Visit: 0 Weight Gained Since Last Visit: 1 Starting Weight: 275 lb Total Weight Loss (lbs): 28 lb (12.7 kg)  Body Composition  Body Fat %: 34.7 % Fat Mass (lbs): 85.8 lbs Muscle Mass (lbs): 153.6 lbs Total Body Water (lbs): 124 lbs Visceral Fat Rating : 20   Patient to continue to work on adhering to Category 4 plan.  Will follow up on Mychart if any changes need to be made prior to next appointment.

## 2024-02-07 ENCOUNTER — Other Ambulatory Visit: Payer: Self-pay | Admitting: Cardiology

## 2024-02-08 ENCOUNTER — Encounter (HOSPITAL_COMMUNITY): Payer: Self-pay

## 2024-02-08 ENCOUNTER — Ambulatory Visit (HOSPITAL_COMMUNITY)
Admission: RE | Admit: 2024-02-08 | Discharge: 2024-02-08 | Disposition: A | Discharge status: Home / Self Care | Source: Ambulatory Visit

## 2024-02-08 VITALS — BP 102/61 | HR 53 | Temp 98.3°F | Resp 16

## 2024-02-08 DIAGNOSIS — U071 COVID-19: Secondary | ICD-10-CM

## 2024-02-08 DIAGNOSIS — B349 Viral infection, unspecified: Secondary | ICD-10-CM | POA: Diagnosis not present

## 2024-02-08 LAB — POC COVID19/FLU A&B COMBO
Covid Antigen, POC: POSITIVE — AB
Influenza A Antigen, POC: NEGATIVE
Influenza B Antigen, POC: NEGATIVE

## 2024-02-08 MED ORDER — AZELASTINE HCL 0.1 % NA SOLN: 1.0000 | Freq: Two times a day (BID) | NASAL | 1 refills | Status: DC

## 2024-02-08 MED ORDER — BENZONATATE 200 MG PO CAPS: 200.0000 mg | ORAL_CAPSULE | Freq: Three times a day (TID) | ORAL | 0 refills | Status: DC | PRN

## 2024-02-08 NOTE — ED Provider Notes (Signed)
 [facility]  Note:  This document was prepared using Dragon voice recognition software and may include unintentional dictation errors.  MRN: [id number] DOB: December 12, 1960  Subjective:   Earl Hogan is a 63 y.o. male presenting for body aches, cough, nasal congestion, fever, malaise x 3 days.  Patient has been trying Alka-Seltzer cold and Tylenol for symptoms with minimal improvement.  Denies known sick contacts but states he did have denies known sick contacts but states he did have a dinner party on Saturday and has had several people at work that have been coughing and having cold symptoms.  No current facility-administered medications for this encounter.  Current Outpatient Medications:    azelastine (ASTELIN) 0.1 % nasal spray, Place 1 spray into both nostrils 2 (two) times daily. Use in each nostril as directed, Disp: 30 mL, Rfl: 1   benzonatate (TESSALON) 200 MG capsule, Take 1 capsule (200 mg total) by mouth 3 (three) times daily as needed for cough., Disp: 20 capsule, Rfl: 0   acetaminophen (TYLENOL) 500 MG tablet, Take 500 mg by mouth every 6 (six) hours as needed (back pain.). , Disp: , Rfl:    apixaban (ELIQUIS) 5 MG TABS tablet, TAKE 1 TABLET(5 MG) BY MOUTH TWICE DAILY, Disp: 180 tablet, Rfl: 1   atorvastatin (LIPITOR) 20 MG tablet, Take 20 mg by mouth daily., Disp: , Rfl:    BD PEN NEEDLE NANO U/F 32G X 4 MM MISC, USE AS DIRECTED TWICE DAILY, Disp: 100 each, Rfl: 0   cetirizine (ZYRTEC) 10 MG tablet, Take 10 mg by mouth at bedtime., Disp: , Rfl:    Cholecalciferol (VITAMIN D3) 125 MCG (5000 UT) CAPS, Take 1 capsule (5,000 Units total) by mouth daily., Disp: 100 capsule, Rfl: 0   escitalopram (LEXAPRO) 10 MG tablet, Take 10 mg by mouth at bedtime. , Disp: , Rfl:    flecainide (TAMBOCOR) 150 MG tablet, TAKE 1/2 TABLET(75 MG) BY MOUTH EVERY 12 HOURS, Disp: 90 tablet, Rfl: 3   metoprolol succinate (TOPROL-XL) 50 MG 24 hr tablet, TAKE 1 TABLET(50 MG) BY MOUTH DAILY WITH  OR IMMEDIATELY FOLLOWING A MEAL, Disp: 90 tablet, Rfl: 1   multivitamin (ONE-A-DAY MEN'S) TABS tablet, Take 1 tablet by mouth daily., Disp: 100 tablet, Rfl: 0   tadalafil (CIALIS) 20 MG tablet, Take 20 mg by mouth daily as needed for erectile dysfunction., Disp: , Rfl:    Tamsulosin HCl (FLOMAX) 0.4 MG CAPS, Take 1 capsule (0.4 mg total) by mouth daily after breakfast. (Patient taking differently: Take 0.4 mg by mouth in the morning and at bedtime.), Disp: 10 capsule, Rfl: 0   TIZANIDINE HCL PO, Take by mouth., Disp: , Rfl:    Allergies  Allergen Reactions   Penicillins Other (See Comments)    Unknown childhood Did it involve swelling of the face/tongue/throat, SOB, or low BP? Unknown Did it involve sudden or severe rash/hives, skin peeling, or any reaction on the inside of your mouth or nose? Unknown Did you need to seek medical attention at a hospital or doctor's office? Unknown When did it last happen? Childhood reaction. If all above answers are "NO", may proceed with cephalosporin use.     Past Medical History:  Diagnosis Date   Atrial fibrillation (HCC)    Back pain    Back pain    Bilateral swelling of feet    Chest pain    Constipation    Depression    Diverticulosis    Heartburn    History of kidney  stones    Hx of seasonal allergies    Hypercholesteremia    Joint pain    Mixed hyperlipidemia 04/04/2021   OA (osteoarthritis)    Palpitations    Pre-diabetes    Sleep apnea    SOB (shortness of breath)    Umbilical hernia    Vitamin D deficiency    Wears glasses      Past Surgical History:  Procedure Laterality Date   CARDIAC CATHETERIZATION     prior to 2010   COLONOSCOPY     UMBILICAL HERNIA REPAIR N/A 05/17/2019   Procedure: LAPAROSCOPIC UMBILICAL HERNIA REPAIR WITH MESH;  Surgeon: Axel Filler, MD;  Location: [facility] OR;  Service: General;  Laterality: N/A;   WISDOM TOOTH EXTRACTION     WRIST SURGERY     left    Family History  Problem Relation Age of  Onset   Hyperlipidemia Mother    Hypertension Mother    Diabetes Mother    Heart disease Mother 91   Obesity Mother    Cancer Father        died in his 37s    Social History   Tobacco Use   Smoking status: Never   Smokeless tobacco: Never  Vaping Use   Vaping status: Never Used  Substance Use Topics   Alcohol use: Yes    Comment: occasional   Drug use: No    ROS Refer to HPI for ROS details.  Objective:   Vitals: BP 102/61 (BP Location: Left Arm)   Pulse (!) 53   Temp 98.3 F (36.8 C) (Oral)   Resp 16   SpO2 93%   Physical Exam Vitals and nursing note reviewed.  Constitutional:      General: He is not in acute distress.    Appearance: Normal appearance. He is well-developed. He is not ill-appearing or toxic-appearing.  HENT:     Head: Normocephalic and atraumatic.     Nose: Congestion and rhinorrhea present.     Mouth/Throat:     Mouth: Mucous membranes are moist.     Pharynx: Oropharynx is clear. No oropharyngeal exudate or posterior oropharyngeal erythema.  Eyes:     General:        Right eye: No discharge.        Left eye: No discharge.     Extraocular Movements: Extraocular movements intact.     Conjunctiva/sclera: Conjunctivae normal.  Cardiovascular:     Rate and Rhythm: Normal rate and regular rhythm.     Heart sounds: No murmur heard. Pulmonary:     Effort: Pulmonary effort is normal. No respiratory distress.     Breath sounds: Normal breath sounds. No stridor. No wheezing, rhonchi or rales.  Skin:    General: Skin is warm and dry.  Neurological:     General: No focal deficit present.     Mental Status: He is alert and oriented to person, place, and time.  Psychiatric:        Mood and Affect: Mood normal.        Behavior: Behavior normal.     Procedures  Results for orders placed or performed during the hospital encounter of 02/08/24 (from the past 24 hours)  POC Covid19/Flu A&B Antigen     Status: Abnormal   Collection Time: 02/08/24   9:07 AM  Result Value Ref Range   Influenza A Antigen, POC Negative    Influenza B Antigen, POC Negative    Covid Antigen, POC Positive (A)  Assessment and Plan :   PDMP not reviewed this encounter.  1. COVID-19   2. Acute viral syndrome     COVID-19 infection -Rapid testing for COVID and influenza and UC is positive for COVID-19. -Discussed treatment with Paxlovid with patient elected not to use Paxlovid due to medication interactions -Use prescribed azelastine nasal spray twice daily as needed for nasal congestion and postnasal drip -Use prescribed benzonatate 200 mg capsule 3 times daily as needed for cough suppression -Continue with over-the-counter treatment with Tylenol/ibuprofen and Alka-Seltzer cold medication. -Continue to monitor symptoms for any change in severity if any progression in symptoms follow-up for further evaluation and management  Mirna Mires B, NP 02/08/24 [phone]

## 2024-02-08 NOTE — Discharge Instructions (Addendum)
 COVID-19 infection -Rapid testing for COVID and influenza and UC is positive for COVID-19. -Discussed treatment with Paxlovid with patient elected not to use Paxlovid due to medication interactions -Use prescribed azelastine nasal spray twice daily as needed for nasal congestion and postnasal drip -Use prescribed benzonatate 200 mg capsule 3 times daily as needed for cough suppression -Continue with over-the-counter treatment with Tylenol/ibuprofen and Alka-Seltzer cold medication. -Continue to monitor symptoms for any change in severity if any progression in symptoms follow-up for further evaluation and management

## 2024-02-08 NOTE — ED Triage Notes (Signed)
 Saturday night having body aches, cough, congestion, fevers, malaise. Alka seltzer cold and Tylenol for symptoms.

## 2024-03-06 ENCOUNTER — Encounter (INDEPENDENT_AMBULATORY_CARE_PROVIDER_SITE_OTHER): Payer: Self-pay | Admitting: Family Medicine

## 2024-03-06 ENCOUNTER — Ambulatory Visit (INDEPENDENT_AMBULATORY_CARE_PROVIDER_SITE_OTHER): Payer: 59 | Admitting: Family Medicine

## 2024-03-06 VITALS — BP 90/52 | HR 54 | Temp 98.0°F | Ht 71.0 in | Wt 245.0 lb

## 2024-03-06 DIAGNOSIS — E669 Obesity, unspecified: Secondary | ICD-10-CM | POA: Diagnosis not present

## 2024-03-06 DIAGNOSIS — Z6834 Body mass index (BMI) 34.0-34.9, adult: Secondary | ICD-10-CM | POA: Diagnosis not present

## 2024-03-06 DIAGNOSIS — R0602 Shortness of breath: Secondary | ICD-10-CM | POA: Insufficient documentation

## 2024-03-06 DIAGNOSIS — I959 Hypotension, unspecified: Secondary | ICD-10-CM | POA: Diagnosis not present

## 2024-03-06 NOTE — Assessment & Plan Note (Signed)
 BP lower than normal today.  He is feeling somewhat fatigued.  Only on toprol that could alter BP.  Patient to measure BP intermittently at home over the next 6 weeks and MyChart if he is noticing any consistently low readings.

## 2024-03-06 NOTE — Assessment & Plan Note (Signed)
 Patient has been working on monitoring symptoms while working on weight loss and increasing his physical activity.  RMR from 02/10/22 of 2318.  Symptoms unfortunately have not improved recently due to COVID infection.  Repeat indirect calorimetry today showing RMR of 1987.  Will decrease his total intake to 1600-1750 calories daily and 100 or more grams of protein daily.

## 2024-03-06 NOTE — Progress Notes (Signed)
 SUBJECTIVE:  Chief Complaint: Obesity  Interim History: Patient had COVID since last appointment.  He felt it was a more intense infection and experience than the first time he had COVID.  He is feeling somewhat lethargic today.  His heart rate has been occasionally in the 40s but that was prior to COVID. Has been managing pretty well food wise.  He is experiencing more hunger recently.  He is holding his own on the scale.  He is doing well with breakfast and lunch.  He is going to a conference in a couple of weeks, couple of trips planned over the next 6 months.   Earl Hogan is here to discuss his progress with his obesity treatment plan. He is on the Category 4 Plan and states he is following his eating plan approximately 60% of the time. He states he is exercising 60 minutes 2 times per week.   OBJECTIVE: Visit Diagnoses: Problem List Items Addressed This Visit       Cardiovascular and Mediastinum   Hypotension   BP lower than normal today.  He is feeling somewhat fatigued.  Only on toprol that could alter BP.  Patient to measure BP intermittently at home over the next 6 weeks and MyChart if he is noticing any consistently low readings.        Other   SOBOE (shortness of breath on exertion) - Primary   Patient has been working on monitoring symptoms while working on weight loss and increasing his physical activity.  RMR from 02/10/22 of 2318.  Symptoms unfortunately have not improved recently due to COVID infection.  Repeat indirect calorimetry today showing RMR of 1987.  Will decrease his total intake to 1600-1750 calories daily and 100 or more grams of protein daily.      Other Visit Diagnoses       BMI 34.0-34.9,adult         Obesity with starting BMI of 38.4           Vitals Temp: 98 F (36.7 C) BP: (!) 90/52 Pulse Rate: (!) 54 SpO2: 93 %   Anthropometric Measurements Height: 5\' 11"  (1.803 m) Weight: 245 lb (111.1 kg) BMI (Calculated): 34.19 Weight at Last Visit: 247  lb Weight Lost Since Last Visit: 2 Weight Gained Since Last Visit: 0 Starting Weight: 275 lb Total Weight Loss (lbs): 30 lb (13.6 kg)   Body Composition  Body Fat %: 34.5 % Fat Mass (lbs): 84.6 lbs Muscle Mass (lbs): 152.4 lbs Total Body Water (lbs): 118.8 lbs Visceral Fat Rating : 20   Other Clinical Data RMR: 1987 Fasting: yes Labs: yes Today's Visit #: 23 Starting Date: 02/10/22 Comments: Cat 4     ASSESSMENT AND PLAN:  Diet: Kalik is currently in the action stage of change. As such, his goal is to continue with weight loss efforts and has agreed to the Category 4 Plan.   Exercise:  For substantial health benefits, adults should do at least 150 minutes (2 hours and 30 minutes) a week of moderate-intensity, or 75 minutes (1 hour and 15 minutes) a week of vigorous-intensity aerobic physical activity, or an equivalent combination of moderate- and vigorous-intensity aerobic activity. Aerobic activity should be performed in episodes of at least 10 minutes, and preferably, it should be spread throughout the week.  Behavior Modification:  We discussed the following Behavioral Modification Strategies today: increasing lean protein intake, decreasing simple carbohydrates, meal planning and cooking strategies, keeping healthy foods in the home, and avoiding temptations. We discussed  various medication options to help Maxine with his weight loss efforts and we both agreed to revisit medication options with his electrophysiologist.  Return in about 6 weeks (around 04/17/2024).Marland Kitchen He was informed of the importance of frequent follow up visits to maximize his success with intensive lifestyle modifications for his multiple health conditions.  Attestation Statements:   Reviewed by clinician on day of visit: allergies, medications, problem list, medical history, surgical history, family history, social history, and previous encounter notes.    Reuben Likes, MD

## 2024-03-15 ENCOUNTER — Encounter (INDEPENDENT_AMBULATORY_CARE_PROVIDER_SITE_OTHER): Payer: Self-pay | Admitting: Family Medicine

## 2024-04-17 ENCOUNTER — Ambulatory Visit (INDEPENDENT_AMBULATORY_CARE_PROVIDER_SITE_OTHER): Admitting: Family Medicine

## 2024-04-17 ENCOUNTER — Encounter (INDEPENDENT_AMBULATORY_CARE_PROVIDER_SITE_OTHER): Payer: Self-pay | Admitting: Family Medicine

## 2024-04-17 VITALS — BP 106/61 | HR 50 | Temp 97.8°F | Ht 71.0 in | Wt 248.0 lb

## 2024-04-17 DIAGNOSIS — E669 Obesity, unspecified: Secondary | ICD-10-CM

## 2024-04-17 DIAGNOSIS — Z6834 Body mass index (BMI) 34.0-34.9, adult: Secondary | ICD-10-CM

## 2024-04-17 DIAGNOSIS — I4891 Unspecified atrial fibrillation: Secondary | ICD-10-CM

## 2024-04-17 NOTE — Progress Notes (Signed)
   SUBJECTIVE:  Chief Complaint: Obesity  Interim History: Patient went to [facility] for 3-4 days since last appointment.  He mentions he was there for a conference. He has eaten off the plan a bit and has been losing some of the control he had with intake at night.  He did get down to 245 last week but feels like the weekend does occasionally sabotage him. He is thinking about doing [facility] to get more affordable access to Zepbound  and Wegovy.  Patient mentions he is hoping to not do much over Memorial Day weekend.   Earl Hogan is here to discuss his progress with his obesity treatment plan. He is on the Category 4 Plan and states he is following his eating plan approximately 60 % of the time. He states he is exercising 60 minutes 2 times per week and walking 1.5 miles 2 times per week.   OBJECTIVE: Visit Diagnoses: Problem List Items Addressed This Visit       Cardiovascular and Mediastinum   Atrial fibrillation with rapid ventricular response (HCC) - Primary   Patient on eliquis , flecainide  and toprol .  No changes recently to his medication.  Continue current meds and follow up at next appointment.      Other Visit Diagnoses       Obesity with starting BMI of 38.4         BMI 34.0-34.9,adult           Vitals Temp: 97.8 F (36.6 C) BP: 106/61 Pulse Rate: (!) 50 SpO2: 96 %   Anthropometric Measurements Height: 5\' 11"  (1.803 m) Weight: 248 lb (112.5 kg) BMI (Calculated): 34.6 Weight at Last Visit: 245 lb Weight Lost Since Last Visit: 0 Weight Gained Since Last Visit: 3 Starting Weight: 275 lb Total Weight Loss (lbs): 27 lb (12.2 kg)   Body Composition  Body Fat %: 34.7 % Fat Mass (lbs): 86.2 lbs Muscle Mass (lbs): 154.2 lbs Total Body Water (lbs): 124.4 lbs Visceral Fat Rating : 21   Other Clinical Data Today's Visit #: 24 Starting Date: 02/10/22 Comments: Cat 4     ASSESSMENT AND PLAN:  Diet: Earl Hogan is currently in the action stage of change. As such, his  goal is to continue with weight loss efforts and has agreed to the Category 4 Plan.   Exercise:  For substantial health benefits, adults should do at least 150 minutes (2 hours and 30 minutes) a week of moderate-intensity, or 75 minutes (1 hour and 15 minutes) a week of vigorous-intensity aerobic physical activity, or an equivalent combination of moderate- and vigorous-intensity aerobic activity. Aerobic activity should be performed in episodes of at least 10 minutes, and preferably, it should be spread throughout the week.  Behavior Modification:  We discussed the following Behavioral Modification Strategies today: increasing lean protein intake, decreasing simple carbohydrates, increasing vegetables, meal planning and cooking strategies, keeping healthy foods in the home, and avoiding temptations. We discussed various medication options to help Earl Hogan with his weight loss efforts and we both agreed to look more into [facility] to see if affordable medication is an option for him.  Return in about 5 weeks (around 05/22/2024).   He was informed of the importance of frequent follow up visits to maximize his success with intensive lifestyle modifications for his multiple health conditions.  Attestation Statements:   Reviewed by clinician on day of visit: allergies, medications, problem list, medical history, surgical history, family history, social history, and previous encounter notes.   Earl Frizzle, MD

## 2024-04-17 NOTE — Assessment & Plan Note (Signed)
 Patient on eliquis , flecainide  and toprol .  No changes recently to his medication.  Continue current meds and follow up at next appointment.

## 2024-04-19 NOTE — Progress Notes (Signed)
 Cardiology Office Note Date:  04/19/2024  Patient ID:  Earl Hogan March 22, 1961, MRN [id number] PCP:  Olin Bertin, MD  Cardiologist: Dr. Micael Adas Electrophysiologist: Dr. Marven Slimmer    Chief Complaint:   6 mo  History of Present Illness: Earl Hogan is a 63 y.o. male with history of AFib, AFlutter.  Saw Dr. Marven Slimmer 08/10/22, had lost 30 lbs. Exercising with cardio and taekwondo. He also participates in [facility]. On Mounjaro as well.  Reported a couple episodes of Afib in the last couple months, associated with some chest tightness, also reported some orthostatic dizziness, this not particularly linked to his AF Fairly low burden, felt a good candidate for ablation, pt preferred to continue the same medical management at that time.  I saw him 04/08/23 He is doing very well Earl Hogan on a 2 week [place] vacation recently , a lot of walking, still doing his taekwondo with good exertional capacity, no CP Has indigestion rarely, meal related and TUMS helps it, no CP otherwise No palpitations, has not felt./had Afib since Oct 2023. No near syncope or syncope. No SOB, DOE No bleeding or signs of bleeding Stable intervals Minimal symptoms No changes made  He saw Brandi 10/21/23, very active, minimal if any symptoms of arrhythmia Discussed ablation, watchman, he preferred to maintain his current medication regime.  TODAY  He continues to do well. Seeing weight/wellness had gotten success with weight loss using injectable medication (s) though his insurance no longer covering. Weight loss has stopped though holding without significant weight gain back.  He has not had any AFib Has good exertional capacity No CP, SOB, DOE No near syncope or syncope. No bleeding or signs of bleeding  He continues to exercise regularly, enjoys Taekwondo, does occasionally get hit in the head during sparring though "noone tries to knock anyone out"   AFib/AFlutter/AAD  hx Diagnosed may 2022 Flecainide  started May 2022   Past Medical History:  Diagnosis Date   Atrial fibrillation (HCC)    Back pain    Back pain    Bilateral swelling of feet    Chest pain    Constipation    Depression    Diverticulosis    Heartburn    History of kidney stones    Hx of seasonal allergies    Hypercholesteremia    Joint pain    Mixed hyperlipidemia 04/04/2021   OA (osteoarthritis)    Palpitations    Pre-diabetes    Sleep apnea    SOB (shortness of breath)    Umbilical hernia    Vitamin D  deficiency    Wears glasses     Past Surgical History:  Procedure Laterality Date   CARDIAC CATHETERIZATION     prior to 2010   COLONOSCOPY     UMBILICAL HERNIA REPAIR N/A 05/17/2019   Procedure: LAPAROSCOPIC UMBILICAL HERNIA REPAIR WITH MESH;  Surgeon: Shela Derby, MD;  Location: [facility] OR;  Service: General;  Laterality: N/A;   WISDOM TOOTH EXTRACTION     WRIST SURGERY     left    Current Outpatient Medications  Medication Sig Dispense Refill   acetaminophen  (TYLENOL ) 500 MG tablet Take 500 mg by mouth every 6 (six) hours as needed (back pain.).      apixaban  (ELIQUIS ) 5 MG TABS tablet TAKE 1 TABLET(5 MG) BY MOUTH TWICE DAILY 180 tablet 1   atorvastatin  (LIPITOR) 20 MG tablet Take 20 mg by mouth daily.     BD PEN NEEDLE NANO  U/F 32G X 4 MM MISC USE AS DIRECTED TWICE DAILY 100 each 0   cetirizine (ZYRTEC) 10 MG tablet Take 10 mg by mouth at bedtime.     Cholecalciferol (VITAMIN D3) 125 MCG (5000 UT) CAPS Take 1 capsule (5,000 Units total) by mouth daily. 100 capsule 0   escitalopram  (LEXAPRO ) 10 MG tablet Take 10 mg by mouth at bedtime.      flecainide  (TAMBOCOR ) 150 MG tablet TAKE 1/2 TABLET(75 MG) BY MOUTH EVERY 12 HOURS 90 tablet 3   metoprolol  succinate (TOPROL -XL) 50 MG 24 hr tablet TAKE 1 TABLET(50 MG) BY MOUTH DAILY WITH OR IMMEDIATELY FOLLOWING A MEAL 90 tablet 2   multivitamin (ONE-A-DAY MEN'S) TABS tablet Take 1 tablet by mouth daily. 100 tablet 0    tadalafil (CIALIS) 20 MG tablet Take 20 mg by mouth daily as needed for erectile dysfunction.     Tamsulosin  HCl (FLOMAX ) 0.4 MG CAPS Take 1 capsule (0.4 mg total) by mouth daily after breakfast. (Patient taking differently: Take 0.4 mg by mouth in the morning and at bedtime.) 10 capsule 0   TIZANIDINE HCL PO Take by mouth.     No current facility-administered medications for this visit.    Allergies:   Penicillins   Social History:  The patient  reports that he has never smoked. He has never used smokeless tobacco. He reports current alcohol use. He reports that he does not use drugs.   Family History:  The patient's family history includes Cancer in his father; Diabetes in his mother; Heart disease (age of onset: 49) in his mother; Hyperlipidemia in his mother; Hypertension in his mother; Obesity in his mother.  ROS:  Please see the history of present illness.    All other systems are reviewed and otherwise negative.   PHYSICAL EXAM:  VS:  There were no vitals taken for this visit. BMI: There is no height or weight on file to calculate BMI. Well nourished, well developed, in no acute distress HEENT: normocephalic, atraumatic Neck: no JVD, carotid bruits or masses Cardiac:   RRR; no significant murmurs, no rubs, or gallops Lungs:  CTA b/l, no wheezing, rhonchi or rales Abd: soft, nontender MS: no deformity or  atrophy Ext:  no edema Skin: warm and dry, no rash Neuro:  No gross deficits appreciated Psych: euthymic mood, full affect   EKG:  Done today and reviewed by myself shows  SB 54bpm, PR , QRS 96ms, QTc Stable intervals   05/01/21: stress myoview  Nuclear stress EF: 48%. The left ventricular ejection fraction is mildly decreased (45-54%). No T wave inversion was noted during stress. There was no ST segment deviation noted during stress. This is an intermediate risk study.   IMPRESSIONS Negative stress induced arrhythmias. No evidence of ischemia or  infarction. Mild decrease in LVEF (48%).     04/05/21: TTE 1. Left ventricular ejection fraction, by estimation, is 60 to 65%. The  left ventricle has normal function. The left ventricle has no regional  wall motion abnormalities. Left ventricular diastolic function could not  be evaluated.   2. Right ventricular systolic function is normal. The right ventricular  size is normal. Tricuspid regurgitation signal is inadequate for assessing  PA pressure.   3. Left atrial size was mildly dilated.   4. Right atrial size was mildly dilated.   5. The mitral valve is normal in structure. No evidence of mitral valve  regurgitation. No evidence of mitral stenosis.   6. The aortic valve is normal  in structure. Aortic valve regurgitation is  not visualized. No aortic stenosis is present.   7. The inferior vena cava is normal in size with greater than 50%  respiratory variability, suggesting right atrial pressure of 3 mmHg.   Recent Labs: No results found for requested labs within last 365 days.  No results found for requested labs within last 365 days.   CrCl cannot be calculated (Patient's most recent lab result is older than the maximum 21 days allowed.).   Wt Readings from Last 3 Encounters:  04/17/24 248 lb (112.5 kg)  03/06/24 245 lb (111.1 kg)  01/24/24 247 lb (112 kg)     Other studies reviewed: Additional studies/records reviewed today include: summarized above  ASSESSMENT AND PLAN:  Paroxysmal Afib Aflutter  CHA2DS2Vasc is zero, maintained on Eliquis , appropriately dosed Flecainide /metoprolol  stable intervals Low/no burden by symptoms  Given good rhythm control and low risk score Discussed perhaps stopping his Eliquis  given his Yates Helm that I would discuss with Dr. Marven Slimmer. He would be happy to make that change  ADDEND: Discussed with Dr. Marven Slimmer Agreed given low risk score > stop Eliquis  > start ECASA 81mg  daily I tried to get the patient on the phone, left a  message, will ask my MA to call him tomorrow.   Disposition: F/u with us  in 25mo, sooner if needed  Current medicines are reviewed at length with the patient today.  The patient did not have any concerns regarding medicines.  [place], PA-C 04/19/2024 1:32 PM     [facility] [address] [place] [address] [phone] (office)  [phone] (fax)

## 2024-04-20 ENCOUNTER — Ambulatory Visit: Attending: Physician Assistant | Admitting: Physician Assistant

## 2024-04-20 ENCOUNTER — Encounter: Payer: Self-pay | Admitting: Physician Assistant

## 2024-04-20 VITALS — BP 98/56 | HR 54 | Ht 71.0 in | Wt 253.0 lb

## 2024-04-20 DIAGNOSIS — Z5181 Encounter for therapeutic drug level monitoring: Secondary | ICD-10-CM | POA: Diagnosis not present

## 2024-04-20 DIAGNOSIS — I480 Paroxysmal atrial fibrillation: Secondary | ICD-10-CM | POA: Diagnosis not present

## 2024-04-20 DIAGNOSIS — Z79899 Other long term (current) drug therapy: Secondary | ICD-10-CM

## 2024-04-20 DIAGNOSIS — I4892 Unspecified atrial flutter: Secondary | ICD-10-CM | POA: Diagnosis not present

## 2024-04-20 NOTE — Patient Instructions (Signed)
 Medication Instructions:    Your physician recommends that you continue on your current medications as directed. Please refer to the Current Medication list given to you today.   *If you need a refill on your cardiac medications before your next appointment, please call your pharmacy*  Lab Work:  NONE ORDERED  TODAY   If you have labs (blood work) drawn today and your tests are completely normal, you will receive your results only by: MyChart Message (if you have MyChart) OR A paper copy in the mail If you have any lab test that is abnormal or we need to change your treatment, we will call you to review the results.   Testing/Procedures: NONE ORDERED  TODAY    Follow-Up: At [facility], you and your health needs are our priority.  As part of our continuing mission to provide you with exceptional heart care, our providers are all part of one team.  This team includes your primary Cardiologist (physician) and Advanced Practice Providers or APPs (Physician Assistants and Nurse Practitioners) who all work together to provide you with the care you need, when you need it.  Your next appointment:    6 month(s)   Provider:    You may see Boyce Byes, MD or one of the following Advanced Practice Providers on your designated Care Team:   Mertha Abrahams, [place]    We recommend signing up for the patient portal called "MyChart".  Sign up information is provided on this After Visit Summary.  MyChart is used to connect with patients for Virtual Visits (Telemedicine).  Patients are able to view lab/test results, encounter notes, upcoming appointments, etc.  Non-urgent messages can be sent to your provider as well.   To learn more about what you can do with MyChart, go to [email or web address].

## 2024-04-21 NOTE — Addendum Note (Signed)
 Addended by: Ripley Chen on: 04/21/2024 07:39 AM   Modules accepted: Orders

## 2024-04-27 ENCOUNTER — Encounter (INDEPENDENT_AMBULATORY_CARE_PROVIDER_SITE_OTHER): Payer: Self-pay | Admitting: Family Medicine

## 2024-06-05 ENCOUNTER — Encounter (INDEPENDENT_AMBULATORY_CARE_PROVIDER_SITE_OTHER): Payer: Self-pay | Admitting: Family Medicine

## 2024-06-05 ENCOUNTER — Ambulatory Visit (INDEPENDENT_AMBULATORY_CARE_PROVIDER_SITE_OTHER): Admitting: Family Medicine

## 2024-06-05 VITALS — BP 108/68 | HR 52 | Temp 98.0°F | Ht 71.0 in | Wt 250.0 lb

## 2024-06-05 DIAGNOSIS — G4733 Obstructive sleep apnea (adult) (pediatric): Secondary | ICD-10-CM

## 2024-06-05 DIAGNOSIS — I4891 Unspecified atrial fibrillation: Secondary | ICD-10-CM | POA: Diagnosis not present

## 2024-06-05 DIAGNOSIS — Z6834 Body mass index (BMI) 34.0-34.9, adult: Secondary | ICD-10-CM | POA: Diagnosis not present

## 2024-06-05 MED ORDER — ZEPBOUND 2.5 MG/0.5ML ~~LOC~~ SOAJ: 2.5000 mg | SUBCUTANEOUS | 0 refills | Status: DC

## 2024-06-05 NOTE — Assessment & Plan Note (Addendum)
 Sleep study on May 2022 showing severe obstructive sleep apnea with AHI of 57/hr.  Oxygen saturations as low as 73%. Concurrent atrial fibrillation.  He is interested in pursuing assistance for management. Prescription for Zepbound  sent in.

## 2024-06-05 NOTE — Progress Notes (Signed)
 SUBJECTIVE:  Chief Complaint: Obesity  Interim History: Patient here for follow up.  Since mid- May he went to [place] last week with grandkids and children.  [place] he ate quite a bit and walked a lot.  For the rest of the summer he has two additional trips on the docent.  He is going to [employer] and [place].  He has new insurance now and doesn't think they will approve anything weight loss in terms of pharmacology.    Antwone is here to discuss his progress with his obesity treatment plan. He is on the Category 4 Plan and states he is following his eating plan approximately 60-65 % of the time. He states he is exercising 60 minutes 2 times per week.  He is still going to [employer].     OBJECTIVE: Visit Diagnoses: Problem List Items Addressed This Visit       Cardiovascular and Mediastinum   Atrial fibrillation with rapid ventricular response [facility])   Patient has been working on lifestyle changes to control the controllable contribution to atrial fibrillation.  He does not drink to excess.  He has hypertension additionally.  Will try Zepbound  to see if this can help with weight loss and decrease weight association to a. Fib and hypertension.      Relevant Medications   tirzepatide  (ZEPBOUND ) 2.5 MG/0.5ML Pen     Respiratory   OSA (obstructive sleep apnea) - Primary   Sleep study on May 2022 showing severe obstructive sleep apnea with AHI of 57/hr.  Oxygen saturations as low as 73%. Concurrent atrial fibrillation.  He is interested in pursuing assistance for management. Prescription for Zepbound  sent in.      Relevant Medications   tirzepatide  (ZEPBOUND ) 2.5 MG/0.5ML Pen     Other   Severe obesity (BMI 35.0-35.9 with comorbidity) (HCC)   Relevant Medications   tirzepatide  (ZEPBOUND ) 2.5 MG/0.5ML Pen   Other Visit Diagnoses       BMI 34.0-34.9,adult           No data recorded       ASSESSMENT AND PLAN: Assessment & Plan OSA (obstructive sleep  apnea) Sleep study on May 2022 showing severe obstructive sleep apnea with AHI of 57/hr.  Oxygen saturations as low as 73%. Concurrent atrial fibrillation.  He is interested in pursuing assistance for management. Prescription for Zepbound  sent in. Atrial fibrillation with rapid ventricular response [facility]) Patient has been working on lifestyle changes to control the controllable contribution to atrial fibrillation.  He does not drink to excess.  He has hypertension additionally.  Will try Zepbound  to see if this can help with weight loss and decrease weight association to a. Fib and hypertension. Severe obesity (BMI 35.0-35.9 with comorbidity) (HCC) Anthropometric Measurements Height: 5' 11 (1.803 m) Weight: 250 lb (113.4 kg) BMI (Calculated): 34.88 Weight at Last Visit: 248 lb Weight Lost Since Last Visit: 0 Weight Gained Since Last Visit: 2 Starting Weight: 275 lb Total Weight Loss (lbs): 25 lb (11.3 kg) Body Composition  Body Fat %: 34.7 % Fat Mass (lbs): 87 lbs Muscle Mass (lbs): 155.8 lbs Total Body Water (lbs): 128 lbs Visceral Fat Rating : 21 Other Clinical Data Today's Visit #: 25 Starting Date: 02/10/22 Comments: Cat 4  BMI 34.0-34.9,adult    Diet: Tysean is currently in the action stage of change. As such, his goal is to continue with weight loss efforts and has agreed to the Category 4 Plan.   Exercise:  For substantial health  benefits, adults should do at least 150 minutes (2 hours and 30 minutes) a week of moderate-intensity, or 75 minutes (1 hour and 15 minutes) a week of vigorous-intensity aerobic physical activity, or an equivalent combination of moderate- and vigorous-intensity aerobic activity. Aerobic activity should be performed in episodes of at least 10 minutes, and preferably, it should be spread throughout the week.  Patient to continue twice weekly Sherren Medin Do.  Behavior Modification:  We discussed the following Behavioral Modification Strategies today:  increasing lean protein intake, decreasing simple carbohydrates, increasing vegetables, meal planning and cooking strategies, and keeping healthy foods in the home.   Return in about 12 weeks (around 08/28/2024).   He was informed of the importance of frequent follow up visits to maximize his success with intensive lifestyle modifications for his multiple health conditions.  Attestation Statements:   Reviewed by clinician on day of visit: allergies, medications, problem list, medical history, surgical history, family history, social history, and previous encounter notes.    Adelita Cho, MD

## 2024-06-08 ENCOUNTER — Encounter (INDEPENDENT_AMBULATORY_CARE_PROVIDER_SITE_OTHER): Payer: Self-pay

## 2024-06-11 NOTE — Assessment & Plan Note (Addendum)
 Patient has been working on lifestyle changes to control the controllable contribution to atrial fibrillation.  He does not drink to excess.  He has hypertension additionally.  Will try Zepbound  to see if this can help with weight loss and decrease weight association to a. Fib and hypertension.

## 2024-06-11 NOTE — Assessment & Plan Note (Signed)
 Anthropometric Measurements Height: 5' 11 (1.803 m) Weight: 250 lb (113.4 kg) BMI (Calculated): 34.88 Weight at Last Visit: 248 lb Weight Lost Since Last Visit: 0 Weight Gained Since Last Visit: 2 Starting Weight: 275 lb Total Weight Loss (lbs): 25 lb (11.3 kg) Body Composition  Body Fat %: 34.7 % Fat Mass (lbs): 87 lbs Muscle Mass (lbs): 155.8 lbs Total Body Water (lbs): 128 lbs Visceral Fat Rating : 21 Other Clinical Data Today's Visit #: 25 Starting Date: 02/10/22 Comments: Cat 4

## 2024-06-19 NOTE — Telephone Encounter (Signed)
 Looks like it could be an allergic reaction.  Would need to d/c and see if resolves.  Wouldn't want him to stay on if having those symptoms.

## 2024-06-19 NOTE — Telephone Encounter (Signed)
 Tried to call patient. Will send mychart. Will send message to EP APP.

## 2024-06-20 NOTE — Telephone Encounter (Signed)
 Spoke with pt who states he continued ASA and symptoms of itching and runny nose abated.  At this time he continues ASA.  Pt advised per Jodie Passey, PA_C due to pt's low risk score he may discontinue aspirin.  Pt verbalizes understanding and thanked Charity fundraiser for the call.

## 2024-08-09 ENCOUNTER — Encounter: Payer: Self-pay | Admitting: Cardiology

## 2024-08-09 ENCOUNTER — Encounter (INDEPENDENT_AMBULATORY_CARE_PROVIDER_SITE_OTHER): Payer: Self-pay | Admitting: Family Medicine

## 2024-08-09 NOTE — Telephone Encounter (Signed)
**Note De-identified  [place] Obfuscation** Please advise 

## 2024-08-22 ENCOUNTER — Other Ambulatory Visit: Payer: Self-pay | Admitting: Physician Assistant

## 2024-08-29 ENCOUNTER — Ambulatory Visit (INDEPENDENT_AMBULATORY_CARE_PROVIDER_SITE_OTHER): Payer: PRIVATE HEALTH INSURANCE | Admitting: Family Medicine

## 2024-08-29 ENCOUNTER — Encounter (INDEPENDENT_AMBULATORY_CARE_PROVIDER_SITE_OTHER): Payer: Self-pay | Admitting: Family Medicine

## 2024-08-29 VITALS — BP 107/61 | HR 49 | Temp 97.8°F | Ht 71.0 in | Wt 252.0 lb

## 2024-08-29 DIAGNOSIS — G4733 Obstructive sleep apnea (adult) (pediatric): Secondary | ICD-10-CM

## 2024-08-29 DIAGNOSIS — E669 Obesity, unspecified: Secondary | ICD-10-CM | POA: Diagnosis not present

## 2024-08-29 DIAGNOSIS — Z6835 Body mass index (BMI) 35.0-35.9, adult: Secondary | ICD-10-CM

## 2024-08-29 MED ORDER — ZEPBOUND 2.5 MG/0.5ML ~~LOC~~ SOAJ: 2.5000 mg | SUBCUTANEOUS | 0 refills | Status: DC

## 2024-08-29 NOTE — Progress Notes (Unsigned)
 SUBJECTIVE:  Chief Complaint: Obesity  Interim History: Patient mentions he is still doing Sherren Medin Do but after his next belt he voices it is not possible due to the cardiac demands.  He also thinks he may need a break and will be getting a bike.  He can't get up to the 3 minutes of sparring.  His wife's mother was in a very serious car accident and required a foot amputation. Additionally his wife's uncle died.  Eating has been a challenge due to different schedules and he has done some late night eating.  He has tried to remain mindful.  He has also had 4 grandchildren's birthdays since last appointment. He did have a trip for 2 weeks that interrupted his consistency of activity. No anticipated travel until December.  Earl Hogan is here to discuss his progress with his obesity treatment plan. He is on the Category 4 Plan and states he is following his eating plan approximately 50 % of the time. He states he is exercising 60 minutes 1 times per week.   OBJECTIVE: Visit Diagnoses: Problem List Items Addressed This Visit       Respiratory   OSA (obstructive sleep apnea) - Primary   Sleep study on May 2022 showing severe obstructive sleep apnea with AHI of 57/hr.  Oxygen saturations as low as 73%. Concurrent atrial fibrillation.  He is interested in pursuing assistance for management. His sleep is not improved and he is looking for additional treatment for OSA.  We discussed Zepbound - risks, benefits and side effects.  Prescription for 2.5mg  Zepbound  sent in.      Relevant Medications   tirzepatide  (ZEPBOUND ) 2.5 MG/0.5ML Pen   Other Visit Diagnoses       Obesity with starting BMI of 38.4       Relevant Medications   tirzepatide  (ZEPBOUND ) 2.5 MG/0.5ML Pen     BMI 35.0-35.9,adult           Vitals Temp: 97.8 F (36.6 C) BP: 107/61 Pulse Rate: (!) 49 SpO2: 96 %   Anthropometric Measurements Height: 5' 11 (1.803 m) Weight: 252 lb (114.3 kg) BMI (Calculated): 35.16 Weight at  Last Visit: 250 lb Weight Lost Since Last Visit: 0 Weight Gained Since Last Visit: 2 Starting Weight: 275 lb Total Weight Loss (lbs): 25 lb (11.3 kg)   Body Composition  Body Fat %: 35.7 % Fat Mass (lbs): 90 lbs Muscle Mass (lbs): 154 lbs Total Body Water (lbs): 125.8 lbs Visceral Fat Rating : 21   Other Clinical Data Today's Visit #: 26 Starting Date: 02/10/22 Comments: Cat 4     ASSESSMENT AND PLAN: Assessment & Plan OSA (obstructive sleep apnea) Sleep study on May 2022 showing severe obstructive sleep apnea with AHI of 57/hr.  Oxygen saturations as low as 73%. Concurrent atrial fibrillation.  He is interested in pursuing assistance for management. His sleep is not improved and he is looking for additional treatment for OSA.  We discussed Zepbound - risks, benefits and side effects.  Prescription for 2.5mg  Zepbound  sent in. Obesity with starting BMI of 38.4  BMI 35.0-35.9,adult    Diet: [name] is currently in the action stage of change. As such, his goal is to continue with weight loss efforts and has agreed to the Category 4 Plan.   Exercise:  For additional and more extensive health benefits, adults should increase their aerobic physical activity to 300 minutes (5 hours) a week of moderate-intensity, or 150 minutes a week of vigorous-intensity aerobic physical activity, or an  equivalent combination of moderate- and vigorous-intensity activity. Additional health benefits are gained by engaging in physical activity beyond this amount.   Behavior Modification:  We discussed the following Behavioral Modification Strategies today: increasing lean protein intake, decreasing simple carbohydrates, increasing vegetables, meal planning and cooking strategies, keeping healthy foods in the home, and planning for success.   Return in about 6 weeks (around 10/10/2024).   He was informed of the importance of frequent follow up visits to maximize his success with intensive lifestyle  modifications for his multiple health conditions.  Attestation Statements:   Reviewed by clinician on day of visit: allergies, medications, problem list, medical history, surgical history, family history, social history, and previous encounter notes.   Adelita Cho, MD

## 2024-08-29 NOTE — Assessment & Plan Note (Signed)
 Sleep study on May 2022 showing severe obstructive sleep apnea with AHI of 57/hr.  Oxygen saturations as low as 73%. Concurrent atrial fibrillation.  He is interested in pursuing assistance for management. His sleep is not improved and he is looking for additional treatment for OSA.  We discussed Zepbound - risks, benefits and side effects.  Prescription for 2.5mg  Zepbound  sent in.

## 2024-08-30 ENCOUNTER — Encounter (INDEPENDENT_AMBULATORY_CARE_PROVIDER_SITE_OTHER): Payer: Self-pay

## 2024-08-31 NOTE — Telephone Encounter (Signed)
 Spoke w/pt.

## 2024-09-13 DIAGNOSIS — Z23 Encounter for immunization: Secondary | ICD-10-CM | POA: Diagnosis not present

## 2024-09-18 DIAGNOSIS — N401 Enlarged prostate with lower urinary tract symptoms: Secondary | ICD-10-CM | POA: Diagnosis not present

## 2024-09-25 DIAGNOSIS — R3915 Urgency of urination: Secondary | ICD-10-CM | POA: Diagnosis not present

## 2024-09-25 DIAGNOSIS — N401 Enlarged prostate with lower urinary tract symptoms: Secondary | ICD-10-CM | POA: Diagnosis not present

## 2024-09-25 DIAGNOSIS — N5201 Erectile dysfunction due to arterial insufficiency: Secondary | ICD-10-CM | POA: Diagnosis not present

## 2024-10-03 ENCOUNTER — Encounter (INDEPENDENT_AMBULATORY_CARE_PROVIDER_SITE_OTHER): Payer: Self-pay | Admitting: Family Medicine

## 2024-10-03 ENCOUNTER — Other Ambulatory Visit (INDEPENDENT_AMBULATORY_CARE_PROVIDER_SITE_OTHER): Payer: Self-pay

## 2024-10-03 DIAGNOSIS — Z6835 Body mass index (BMI) 35.0-35.9, adult: Secondary | ICD-10-CM

## 2024-10-03 DIAGNOSIS — G4733 Obstructive sleep apnea (adult) (pediatric): Secondary | ICD-10-CM

## 2024-10-03 MED ORDER — TIRZEPATIDE-WEIGHT MANAGEMENT 2.5 MG/0.5ML ~~LOC~~ SOLN: 2.5000 mg | SUBCUTANEOUS | 0 refills | Status: DC

## 2024-10-23 ENCOUNTER — Ambulatory Visit (INDEPENDENT_AMBULATORY_CARE_PROVIDER_SITE_OTHER): Payer: Self-pay | Admitting: Family Medicine

## 2024-10-23 DIAGNOSIS — I4891 Unspecified atrial fibrillation: Secondary | ICD-10-CM | POA: Diagnosis not present

## 2024-10-23 DIAGNOSIS — Z6834 Body mass index (BMI) 34.0-34.9, adult: Secondary | ICD-10-CM | POA: Diagnosis not present

## 2024-10-23 MED ORDER — TIRZEPATIDE-WEIGHT MANAGEMENT 5 MG/0.5ML ~~LOC~~ SOLN: 5.0000 mg | SUBCUTANEOUS | 0 refills | Status: DC

## 2024-10-23 MED ORDER — TIRZEPATIDE-WEIGHT MANAGEMENT 10 MG/0.5ML ~~LOC~~ SOLN: 10.0000 mg | SUBCUTANEOUS | 0 refills | Status: DC

## 2024-10-23 NOTE — Progress Notes (Unsigned)
   SUBJECTIVE:  Chief Complaint: Obesity  Interim History: Patient mentions that he is starting to feel a bit different with starting zepbound  vial.  He has been preparing to retire at the end of this year.  He has eaten out more than he planned due to familial demands. He is anticipating having more available time for meal prep when he retires.  He stopped Sherren Medin Do.  He will go to [place] at some point over the holiday season.      Earl Hogan is here to discuss his progress with his obesity treatment plan. He is on the Category 4 Plan and states he is following his eating plan approximately 70 % of the time. He states he is not exercising.   OBJECTIVE: Visit Diagnoses: Problem List Items Addressed This Visit   None Visit Diagnoses       Morbid obesity (HCC)    -  Primary   Relevant Medications   tirzepatide  10 MG/0.5ML injection vial     BMI 34.0-34.9,adult           Vitals Temp: (!) 97.5 F (36.4 C) BP: 114/71 Pulse Rate: (!) 56 SpO2: 99 %   Anthropometric Measurements Height: 5' 11 (1.803 m) Weight: 246 lb (111.6 kg) BMI (Calculated): 34.33 Weight at Last Visit: 252 lb Weight Lost Since Last Visit: 6 Weight Gained Since Last Visit: 0 Starting Weight: 275 lb Total Weight Loss (lbs): 29 lb (13.2 kg)   Body Composition  Body Fat %: 34 % Fat Mass (lbs): 83.8 lbs Muscle Mass (lbs): 154.8 lbs Total Body Water (lbs): 115.8 lbs Visceral Fat Rating : 20   Other Clinical Data Fasting: yes Today's Visit #: 27 Starting Date: 02/10/22 Comments: Cat 4     ASSESSMENT AND PLAN: Assessment & Plan BMI 34.0-34.9,adult  Atrial fibrillation with rapid ventricular response (HCC) Patient has history of atrial fibrillation and is being seen and followed by cardiology.  Due to history of atrial fibrillation was not an appropriate candidate for weight loss medications such as Qsymia given stimulant.  No current symptoms of atrial fibrillation such as palpitations or  shortness of breath.  Follow along with cardiology. Morbid obesity (HCC)    Diet: Laksh is currently in the action stage of change. As such, his goal is to continue with weight loss efforts and has agreed to the Category 4 Plan.   Exercise:  For substantial health benefits, adults should do at least 150 minutes (2 hours and 30 minutes) a week of moderate-intensity, or 75 minutes (1 hour and 15 minutes) a week of vigorous-intensity aerobic physical activity, or an equivalent combination of moderate- and vigorous-intensity aerobic activity. Aerobic activity should be performed in episodes of at least 10 minutes, and preferably, it should be spread throughout the week.  Behavior Modification:  We discussed the following Behavioral Modification Strategies today: increasing lean protein intake, decreasing simple carbohydrates, increasing vegetables, meal planning and cooking strategies, holiday eating strategies, and planning for success. We discussed various medication options to help Javonte with his weight loss efforts and we both agreed to increase zepbound .  Return in about 6 weeks (around 12/04/2024).   He was informed of the importance of frequent follow up visits to maximize his success with intensive lifestyle modifications for his multiple health conditions.  Attestation Statements:   Reviewed by clinician on day of visit: allergies, medications, problem list, medical history, surgical history, family history, social history, and previous encounter notes.   Adelita Cho, MD

## 2024-10-24 NOTE — Assessment & Plan Note (Signed)
 Patient has history of atrial fibrillation and is being seen and followed by cardiology.  Due to history of atrial fibrillation was not an appropriate candidate for weight loss medications such as Qsymia given stimulant.  No current symptoms of atrial fibrillation such as palpitations or shortness of breath.  Follow along with cardiology.

## 2024-10-25 ENCOUNTER — Other Ambulatory Visit (INDEPENDENT_AMBULATORY_CARE_PROVIDER_SITE_OTHER): Payer: Self-pay | Admitting: Family Medicine

## 2024-10-25 DIAGNOSIS — G4733 Obstructive sleep apnea (adult) (pediatric): Secondary | ICD-10-CM

## 2024-10-25 DIAGNOSIS — Z6835 Body mass index (BMI) 35.0-35.9, adult: Secondary | ICD-10-CM

## 2024-11-01 NOTE — Telephone Encounter (Signed)
 Needs 5mg  sent

## 2024-11-05 ENCOUNTER — Other Ambulatory Visit (INDEPENDENT_AMBULATORY_CARE_PROVIDER_SITE_OTHER): Payer: Self-pay | Admitting: Family Medicine

## 2024-11-05 MED ORDER — TIRZEPATIDE-WEIGHT MANAGEMENT 5 MG/0.5ML ~~LOC~~ SOLN: 5.0000 mg | SUBCUTANEOUS | 0 refills | Status: DC

## 2024-11-10 ENCOUNTER — Encounter: Payer: Self-pay | Admitting: Emergency Medicine

## 2024-11-20 ENCOUNTER — Other Ambulatory Visit: Payer: Self-pay

## 2024-11-21 ENCOUNTER — Other Ambulatory Visit (INDEPENDENT_AMBULATORY_CARE_PROVIDER_SITE_OTHER): Payer: Self-pay | Admitting: Family Medicine

## 2024-11-21 MED ORDER — METOPROLOL SUCCINATE ER 50 MG PO TB24: 50.0000 mg | ORAL_TABLET | Freq: Every day | ORAL | 1 refills | Status: AC

## 2024-11-27 ENCOUNTER — Other Ambulatory Visit (INDEPENDENT_AMBULATORY_CARE_PROVIDER_SITE_OTHER): Payer: Self-pay | Admitting: Family Medicine

## 2024-11-27 MED ORDER — TIRZEPATIDE-WEIGHT MANAGEMENT 5 MG/0.5ML ~~LOC~~ SOLN: 5.0000 mg | SUBCUTANEOUS | 0 refills | Status: DC

## 2024-12-06 ENCOUNTER — Ambulatory Visit (INDEPENDENT_AMBULATORY_CARE_PROVIDER_SITE_OTHER): Admitting: Family Medicine

## 2024-12-06 ENCOUNTER — Encounter (INDEPENDENT_AMBULATORY_CARE_PROVIDER_SITE_OTHER): Payer: Self-pay | Admitting: Family Medicine

## 2024-12-06 VITALS — BP 101/66 | HR 68 | Temp 97.8°F | Ht 71.0 in | Wt 244.0 lb

## 2024-12-06 DIAGNOSIS — Z6834 Body mass index (BMI) 34.0-34.9, adult: Secondary | ICD-10-CM

## 2024-12-06 DIAGNOSIS — I4891 Unspecified atrial fibrillation: Secondary | ICD-10-CM | POA: Diagnosis not present

## 2024-12-06 DIAGNOSIS — R7303 Prediabetes: Secondary | ICD-10-CM

## 2024-12-06 MED ORDER — TIRZEPATIDE-WEIGHT MANAGEMENT 5 MG/0.5ML ~~LOC~~ SOLN: 5.0000 mg | SUBCUTANEOUS | 0 refills | Status: AC

## 2024-12-06 NOTE — Progress Notes (Signed)
 "  SUBJECTIVE:  Chief Complaint: Obesity  Interim History: Patient here for follow up since prior to holiday.  He is about to change insurance and is wondering if they require an annual physical. He is retired and is on day 3.  He is slowly readjusting and figuring out a new schedule.  He is interested in pursuing physical activity.  Feels calorie amount is close to goal but he still needs to increase total protein consumption.  Earl Hogan is here to discuss his progress with his obesity treatment plan. He is on the Category 4 Plan and states he is following his eating plan approximately 60 % of the time. He states he is not exercising.  OBJECTIVE: Visit Diagnoses: Problem List Items Addressed This Visit       Cardiovascular and Mediastinum   Atrial fibrillation with rapid ventricular response (HCC)     Other   Pre-diabetes - Primary   Other Visit Diagnoses       BMI 34.0-34.9,adult         Morbid obesity (HCC)       Relevant Medications   tirzepatide  5 MG/0.5ML injection vial       Vitals Temp: 97.8 F (36.6 C) BP: 101/66 Pulse Rate: 68 SpO2: 96 %   Anthropometric Measurements Height: 5' 11 (1.803 m) Weight: 244 lb (110.7 kg) BMI (Calculated): 34.05 Weight at Last Visit: 246 lb Weight Lost Since Last Visit: 2 Weight Gained Since Last Visit: 0 Starting Weight: 275 lb Total Weight Loss (lbs): 31 lb (14.1 kg)   Body Composition  Body Fat %: 33.6 % Fat Mass (lbs): 82.2 lbs Muscle Mass (lbs): 154.2 lbs Total Body Water (lbs): 114.2 lbs Visceral Fat Rating : 20   Other Clinical Data Today's Visit #: 28 Starting Date: 02/10/22 Comments: Cat 4     ASSESSMENT AND PLAN: Assessment & Plan Pre-diabetes Patient is working on blood sugar control with lifestyle modifications.  He has restarted tirzepatide  and feels current dosage is adequate control and cravings as well as allow him dietary intake to be at goal. Atrial fibrillation with rapid ventricular response  [facility]) Patient reports talking about possible tapering off of flecainide  with cardiology.  He has discussed this could happen with treatment after ablation which patient is not currently interested in pursuing.  Defer treatment to cardiology BMI 34.0-34.9,adult  Morbid obesity (HCC)    Diet: Cormac is currently in the action stage of change. As such, his goal is to continue with weight loss efforts and has agreed to the Category 4 Plan.   Exercise:  For substantial health benefits, adults should do at least 150 minutes (2 hours and 30 minutes) a week of moderate-intensity, or 75 minutes (1 hour and 15 minutes) a week of vigorous-intensity aerobic physical activity, or an equivalent combination of moderate- and vigorous-intensity aerobic activity. Aerobic activity should be performed in episodes of at least 10 minutes, and preferably, it should be spread throughout the week.  Behavior Modification:  We discussed the following Behavioral Modification Strategies today: increasing lean protein intake, decreasing simple carbohydrates, increasing vegetables, meal planning and cooking strategies, and planning for success. We discussed various medication options to help Liem with his weight loss efforts and we both agreed to continue tirzepatide  at current dose.  Return in about 5 weeks (around 01/10/2025).   He was informed of the importance of frequent follow up visits to maximize his success with intensive lifestyle modifications for his multiple health conditions.  Attestation Statements:   Reviewed by  clinician on day of visit: allergies, medications, problem list, medical history, surgical history, family history, social history, and previous encounter notes.  Adelita Cho, MD "

## 2024-12-10 NOTE — Assessment & Plan Note (Signed)
 Patient is working on blood sugar control with lifestyle modifications.  He has restarted tirzepatide  and feels current dosage is adequate control and cravings as well as allow him dietary intake to be at goal.

## 2024-12-10 NOTE — Assessment & Plan Note (Signed)
 Patient reports talking about possible tapering off of flecainide  with cardiology.  He has discussed this could happen with treatment after ablation which patient is not currently interested in pursuing.  Defer treatment to cardiology

## 2024-12-26 NOTE — Progress Notes (Unsigned)
 "    Cardiology Office Note Date:  12/26/2024  Patient ID:  Earl Hogan, Earl Hogan 1961-05-17, MRN [id number] PCP:  Dartha Geralds, DO  Cardiologist: Dr. Shlomo Electrophysiologist: Dr. Cindie    Chief Complaint:  *** 6 mo  History of Present Illness: Earl Hogan is a 64 y.o. male with history of AFib, AFlutter.  Saw Dr. Cindie 08/10/22, had lost 30 lbs. Exercising with cardio and taekwondo. He also participates in [facility]. On [place]  as well.  Reported a couple episodes of Afib in the last couple months, associated with some chest tightness, also reported some orthostatic dizziness, this not particularly linked to his AF Fairly low burden, felt a good candidate for ablation, pt preferred to continue the same medical management at that time.  I saw him 04/08/23 He is doing very well Earl Hogan on a 2 week [place] vacation recently , a lot of walking, still doing his taekwondo with good exertional capacity, no CP Has indigestion rarely, meal related and TUMS helps it, no CP otherwise No palpitations, has not felt./had Afib since Oct 2023. No near syncope or syncope. No SOB, DOE No bleeding or signs of bleeding Stable intervals Minimal symptoms No changes made  He saw Brandi 10/21/23, very active, minimal if any symptoms of arrhythmia Discussed ablation, watchman, he preferred to maintain his current medication regime.  I saw him 04/20/24 He continues to do well. Seeing weight/wellness had gotten success with weight loss using injectable medication (s) though his insurance no longer covering. Weight loss has stopped though holding without significant weight gain back. He has not had any AFib Has good exertional capacity No CP, SOB, DOE No near syncope or syncope. No bleeding or signs of bleeding He continues to exercise regularly, enjoys Taekwondo, does occasionally get hit in the head during sparring though noone tries to knock anyone out  AFTER the visit, in  d/w Dr. Cindie > advised stop Eliquis  start ECASA 81mg  daily  Pt July reported itching and runny nose concerned of possible allergy to the ASA >> this did resolve despite continued use, though in d/w [place], given low risk > ASA stopped  TODAY  *** symptoms, Afib *** flecainide , EKG, nodal blocker *** new EP MD   AFib/AFlutter/AAD hx Diagnosed may 2022 Flecainide  started May 2022   Past Medical History:  Diagnosis Date   Atrial fibrillation (HCC)    Back pain    Back pain    Bilateral swelling of feet    Chest pain    Constipation    Depression    Diverticulosis    Heartburn    History of kidney stones    Hx of seasonal allergies    Hypercholesteremia    Joint pain    Mixed hyperlipidemia 04/04/2021   OA (osteoarthritis)    Palpitations    Pre-diabetes    Sleep apnea    SOB (shortness of breath)    Umbilical hernia    Vitamin D  deficiency    Wears glasses     Past Surgical History:  Procedure Laterality Date   CARDIAC CATHETERIZATION     prior to 2010   COLONOSCOPY     UMBILICAL HERNIA REPAIR N/A 05/17/2019   Procedure: LAPAROSCOPIC UMBILICAL HERNIA REPAIR WITH MESH;  Surgeon: Rubin Calamity, MD;  Location: [facility] OR;  Service: General;  Laterality: N/A;   WISDOM TOOTH EXTRACTION     WRIST SURGERY     left    Current Outpatient Medications  Medication Sig Dispense  Refill   acetaminophen  (TYLENOL ) 500 MG tablet Take 500 mg by mouth every 6 (six) hours as needed (back pain.).      atorvastatin  (LIPITOR) 20 MG tablet Take 20 mg by mouth daily.     BD PEN NEEDLE NANO U/F 32G X 4 MM MISC USE AS DIRECTED TWICE DAILY 100 each 0   cetirizine (ZYRTEC) 10 MG tablet Take 10 mg by mouth at bedtime.     Cholecalciferol (VITAMIN D3) 125 MCG (5000 UT) CAPS Take 1 capsule (5,000 Units total) by mouth daily. 100 capsule 0   escitalopram  (LEXAPRO ) 10 MG tablet Take 10 mg by mouth at bedtime.      flecainide  (TAMBOCOR ) 150 MG tablet TAKE 1/2 TABLET(75 MG) BY MOUTH EVERY 12  HOURS 90 tablet 2   metoprolol  succinate (TOPROL -XL) 50 MG 24 hr tablet Take 1 tablet (50 mg total) by mouth daily. Take with or immediately following a meal. 90 tablet 1   multivitamin (ONE-A-DAY MEN'S) TABS tablet Take 1 tablet by mouth daily. 100 tablet 0   tadalafil (CIALIS) 20 MG tablet Take 20 mg by mouth daily as needed for erectile dysfunction.     Tamsulosin  HCl (FLOMAX ) 0.4 MG CAPS Take 1 capsule (0.4 mg total) by mouth daily after breakfast. (Patient taking differently: Take 0.4 mg by mouth in the morning and at bedtime.) 10 capsule 0   tirzepatide  5 MG/0.5ML injection vial Inject 5 mg into the skin once a week. 2 mL 0   TIZANIDINE HCL PO Take by mouth.     No current facility-administered medications for this visit.    Allergies:   Penicillins   Social History:  The patient  reports that he has never smoked. He has never used smokeless tobacco. He reports current alcohol use. He reports that he does not use drugs.   Family History:  The patient's family history includes Cancer in his father; Diabetes in his mother; Heart disease (age of onset: 31) in his mother; Hyperlipidemia in his mother; Hypertension in his mother; Obesity in his mother.  ROS:  Please see the history of present illness.    All other systems are reviewed and otherwise negative.   PHYSICAL EXAM:  VS:  There were no vitals taken for this visit. BMI: There is no height or weight on file to calculate BMI. Well nourished, well developed, in no acute distress HEENT: normocephalic, atraumatic Neck: no JVD, carotid bruits or masses Cardiac:  *** RRR; no significant murmurs, no rubs, or gallops Lungs: *** CTA b/l, no wheezing, rhonchi or rales Abd: soft, nontender MS: no deformity or  atrophy Ext: *** no edema Skin: warm and dry, no rash Neuro:  No gross deficits appreciated Psych: euthymic mood, full affect   EKG:  not done today   05/01/21: stress myoview  Nuclear stress EF: 48%. The left ventricular  ejection fraction is mildly decreased (45-54%). No T wave inversion was noted during stress. There was no ST segment deviation noted during stress. This is an intermediate risk study.   IMPRESSIONS Negative stress induced arrhythmias. No evidence of ischemia or infarction. Mild decrease in LVEF (48%).     04/05/21: TTE 1. Left ventricular ejection fraction, by estimation, is 60 to 65%. The  left ventricle has normal function. The left ventricle has no regional  wall motion abnormalities. Left ventricular diastolic function could not  be evaluated.   2. Right ventricular systolic function is normal. The right ventricular  size is normal. Tricuspid regurgitation signal is inadequate for  assessing  PA pressure.   3. Left atrial size was mildly dilated.   4. Right atrial size was mildly dilated.   5. The mitral valve is normal in structure. No evidence of mitral valve  regurgitation. No evidence of mitral stenosis.   6. The aortic valve is normal in structure. Aortic valve regurgitation is  not visualized. No aortic stenosis is present.   7. The inferior vena cava is normal in size with greater than 50%  respiratory variability, suggesting right atrial pressure of 3 mmHg.   Recent Labs: No results found for requested labs within last 365 days.  No results found for requested labs within last 365 days.   CrCl cannot be calculated (Patient's most recent lab result is older than the maximum 21 days allowed.).   Wt Readings from Last 3 Encounters:  12/06/24 244 lb (110.7 kg)  10/23/24 246 lb (111.6 kg)  08/29/24 252 lb (114.3 kg)     Other studies reviewed: Additional studies/records reviewed today include: summarized above  ASSESSMENT AND PLAN:  Paroxysmal Afib Aflutter  CHA2DS2Vasc is zero, maintained on Eliquis , appropriately dosed Flecainide /***metoprolol  *** stable intervals *** Low/no burden by symptoms    Disposition: F/u with us  in ***, sooner if needed  Current  medicines are reviewed at length with the patient today.  The patient did not have any concerns regarding medicines.  Bonney Charlies Arthur, PA-C 12/26/2024 2:27 PM     [facility] [address] [place] [address] [phone] (office)  [phone] (fax)   "

## 2024-12-28 ENCOUNTER — Ambulatory Visit: Attending: Physician Assistant | Admitting: Physician Assistant

## 2024-12-28 VITALS — BP 112/64 | HR 65 | Ht 70.0 in | Wt 250.0 lb

## 2024-12-28 DIAGNOSIS — R079 Chest pain, unspecified: Secondary | ICD-10-CM | POA: Diagnosis not present

## 2024-12-28 DIAGNOSIS — Z5181 Encounter for therapeutic drug level monitoring: Secondary | ICD-10-CM | POA: Diagnosis not present

## 2024-12-28 DIAGNOSIS — I4892 Unspecified atrial flutter: Secondary | ICD-10-CM | POA: Diagnosis not present

## 2024-12-28 DIAGNOSIS — I480 Paroxysmal atrial fibrillation: Secondary | ICD-10-CM | POA: Diagnosis not present

## 2024-12-28 DIAGNOSIS — Z79899 Other long term (current) drug therapy: Secondary | ICD-10-CM

## 2024-12-28 LAB — BASIC METABOLIC PANEL WITH GFR
BUN/Creatinine Ratio: 23 (ref 10–24)
BUN: 22 mg/dL (ref 8–27)
CO2: 22 mmol/L (ref 20–29)
Calcium: 9.5 mg/dL (ref 8.6–10.2)
Chloride: 102 mmol/L (ref 96–106)
Creatinine, Ser: 0.94 mg/dL (ref 0.76–1.27)
Glucose: 93 mg/dL (ref 70–99)
Potassium: 5 mmol/L (ref 3.5–5.2)
Sodium: 137 mmol/L (ref 134–144)
eGFR: 91 mL/min/{1.73_m2}

## 2024-12-28 MED ORDER — METOPROLOL TARTRATE 100 MG PO TABS: 100.0000 mg | ORAL_TABLET | Freq: Once | ORAL | 0 refills | Status: AC

## 2024-12-28 NOTE — Patient Instructions (Addendum)
 Medication Instructions:   TAKE  ONCE  ONLY :  METOPROLOL  100 MG  2 HOURS PRIOR TO SCHEDULED  PROCEDURE   *If you need a refill on your cardiac medications before your next appointment, please call your pharmacy*   Lab Work:   PLEASE GO DOWN STAIRS  [facility]  FIRST FLOOR   ( GET OFF ELEVATORS WALK TOWARDS WAITING AREA LAB LOCATED BY PHARMACY): BMET  TODAY     If you have labs (blood work) drawn today and your tests are completely normal, you will receive your results only by: MyChart Message (if you have MyChart) OR A paper copy in the mail If you have any lab test that is abnormal or we need to change your treatment, we will call you to review the results.    Testing/Procedures: Non-Cardiac CT Angiography (CTA), is a special type of CT scan that uses a computer to produce multi-dimensional views of major blood vessels throughout the body. In CT angiography, a contrast material is injected through an IV to help visualize the blood vessels      Follow-Up: At [facility], you and your health needs are our priority.  As part of our continuing mission to provide you with exceptional heart care, our providers are all part of one team.  This team includes your primary Cardiologist (physician) and Advanced Practice Providers or APPs (Physician Assistants and Nurse Practitioners) who all work together to provide you with the care you need, when you need it.  Your next appointment:   6 month(s)  Provider:    Fonda Kitty, MD    We recommend signing up for the patient portal called MyChart.  Sign up information is provided on this After Visit Summary.  MyChart is used to connect with patients for Virtual Visits (Telemedicine).  Patients are able to view lab/test results, encounter notes, upcoming appointments, etc.  Non-urgent messages can be sent to your provider as well.   To learn more about what you can do with MyChart, go to [email or web address].   Other  Instructions.    Your cardiac CT will be scheduled at one of the below locations:   [facility] [address] [place], [place] [address] [phone] (Severe contrast allergies only)  OR   [facility] [address] [place], [place] [address] [phone]  OR   [facility] [address] [place], [place] [address] [phone]  OR   [facility] [address]  [place], [place] [address]  OR   [facility] [address] [place], [place] [phone]  If scheduled at [facility], please arrive at the [facility] En[facility]ance (Entrance C2) of [facility] 30 minutes prior to test start time. You can use the FREE valet parking offered at entrance C (encouraged to control the heart rate for the test)  Proceed to the [facility] Radiology Department (first floor) to check-in and test prep.  All radiology patients and guests should use entrance C2 at [facility], accessed from [facility], even though the hospital's physical address listed is [address].  If scheduled at the Heart and Vascular Tower at [address], please enter the parking lot using the [address] entrance and use the FREE valet service at the patient drop-off area. Enter the building and check-in with registration on the main floor.  If scheduled at [facility], please arrive  to the Heart and Vascular Center 15 mins early for check-in and test prep.  There is spacious parking and easy access to the radiology department from the [facility] Heart and Vascular entrance. Please enter here and check-in with the desk attendant.   If scheduled at [facility], please arrive 30 minutes early for check-in and test prep.  Please follow these instructions carefully (unless otherwise directed):  An IV will be required for this test and  Nitroglycerin will be given.     On the Night Before the Test: Be sure to Drink plenty of water. Do not consume any caffeinated/decaffeinated beverages or chocolate 12 hours prior to your test. Do not take any antihistamines 12 hours prior to your test.   On the Day of the Test: Drink plenty of water until 1 hour prior to the test. Do not eat any food 1 hour prior to test. You may take your regular medications prior to the test.  Take metoprolol  (Lopressor ) two hours prior to test. If you take Furosemide/Hydrochlorothiazide/Spironolactone/Chlorthalidone, please HOLD on the morning of the test. Patients who wear a continuous glucose monitor MUST remove the device prior to scanning.        After the Test: Drink plenty of water. After receiving IV contrast, you may experience a mild flushed feeling. This is normal. On occasion, you may experience a mild rash up to 24 hours after the test. This is not dangerous. If this occurs, you can take Benadryl 25 mg, Zyrtec, Claritin , or Allegra and increase your fluid intake. (Patients taking Tikosyn should avoid Benadryl, and may take Zyrtec, Claritin , or Allegra) If you experience trouble breathing, this can be serious. If it is severe call 911 IMMEDIATELY. If it is mild, please call our office.  We will call to schedule your test 2-4 weeks out understanding that some insurance companies will need an authorization prior to the service being performed.   For more information and frequently asked questions, please visit our website : [email or web address]  For non-scheduling related questions, please contact the cardiac imaging nurse navigator should you have any questions/concerns: Cardiac Imaging Nurse Navigators Direct Office Dial: [phone]   For scheduling needs, including cancellations and rescheduling, please call Brittany, [phone].  For billing questions, please call [phone].

## 2025-01-01 ENCOUNTER — Ambulatory Visit: Payer: Self-pay | Admitting: Physician Assistant

## 2025-01-12 ENCOUNTER — Ambulatory Visit (HOSPITAL_COMMUNITY)

## 2025-01-18 ENCOUNTER — Ambulatory Visit (INDEPENDENT_AMBULATORY_CARE_PROVIDER_SITE_OTHER): Admitting: Family Medicine
